# Patient Record
Sex: Male | Born: 1950 | Race: Black or African American | Hispanic: No | Marital: Single
Health system: Southern US, Community
[De-identification: ages and names within clinical notes are randomized; demographics above are authoritative.]

## PROBLEM LIST (undated history)

## (undated) DIAGNOSIS — E785 Hyperlipidemia, unspecified: Secondary | ICD-10-CM

## (undated) DIAGNOSIS — M549 Dorsalgia, unspecified: Secondary | ICD-10-CM

## (undated) DIAGNOSIS — H409 Unspecified glaucoma: Secondary | ICD-10-CM

## (undated) DIAGNOSIS — G8929 Other chronic pain: Secondary | ICD-10-CM

## (undated) DIAGNOSIS — B20 Human immunodeficiency virus [HIV] disease: Secondary | ICD-10-CM

## (undated) DIAGNOSIS — R9431 Abnormal electrocardiogram [ECG] [EKG]: Secondary | ICD-10-CM

## (undated) DIAGNOSIS — I639 Cerebral infarction, unspecified: Secondary | ICD-10-CM

## (undated) HISTORY — PX: PROSTATE SURGERY: SHX751

## (undated) HISTORY — PX: BACK SURGERY: SHX140

## (undated) HISTORY — PX: TONSILLECTOMY: SUR1361

## (undated) HISTORY — PX: WISDOM TOOTH EXTRACTION: SHX21

## (undated) HISTORY — PX: HERNIA REPAIR: SHX51

---

## 2017-10-09 ENCOUNTER — Observation Stay (HOSPITAL_COMMUNITY)
Admission: EM | Admit: 2017-10-09 | Discharge: 2017-10-10 | Disposition: A | Discharge status: Home / Self Care | Payer: PRIVATE HEALTH INSURANCE | Attending: Internal Medicine | Admitting: Internal Medicine

## 2017-10-09 ENCOUNTER — Emergency Department (HOSPITAL_COMMUNITY): Payer: PRIVATE HEALTH INSURANCE

## 2017-10-09 ENCOUNTER — Encounter (HOSPITAL_COMMUNITY): Payer: Self-pay

## 2017-10-09 ENCOUNTER — Other Ambulatory Visit: Payer: Self-pay

## 2017-10-09 ENCOUNTER — Inpatient Hospital Stay (HOSPITAL_COMMUNITY): Payer: PRIVATE HEALTH INSURANCE

## 2017-10-09 DIAGNOSIS — Z79899 Other long term (current) drug therapy: Secondary | ICD-10-CM | POA: Insufficient documentation

## 2017-10-09 DIAGNOSIS — H409 Unspecified glaucoma: Secondary | ICD-10-CM

## 2017-10-09 DIAGNOSIS — E785 Hyperlipidemia, unspecified: Secondary | ICD-10-CM | POA: Diagnosis not present

## 2017-10-09 DIAGNOSIS — I4510 Unspecified right bundle-branch block: Secondary | ICD-10-CM | POA: Insufficient documentation

## 2017-10-09 DIAGNOSIS — R55 Syncope and collapse: Secondary | ICD-10-CM | POA: Insufficient documentation

## 2017-10-09 DIAGNOSIS — G4733 Obstructive sleep apnea (adult) (pediatric): Secondary | ICD-10-CM | POA: Diagnosis not present

## 2017-10-09 DIAGNOSIS — G8929 Other chronic pain: Secondary | ICD-10-CM | POA: Insufficient documentation

## 2017-10-09 DIAGNOSIS — R4781 Slurred speech: Secondary | ICD-10-CM | POA: Diagnosis not present

## 2017-10-09 DIAGNOSIS — R200 Anesthesia of skin: Secondary | ICD-10-CM | POA: Insufficient documentation

## 2017-10-09 DIAGNOSIS — R079 Chest pain, unspecified: Secondary | ICD-10-CM | POA: Insufficient documentation

## 2017-10-09 DIAGNOSIS — I272 Pulmonary hypertension, unspecified: Secondary | ICD-10-CM | POA: Diagnosis not present

## 2017-10-09 DIAGNOSIS — R61 Generalized hyperhidrosis: Secondary | ICD-10-CM | POA: Insufficient documentation

## 2017-10-09 DIAGNOSIS — F410 Panic disorder [episodic paroxysmal anxiety] without agoraphobia: Secondary | ICD-10-CM | POA: Insufficient documentation

## 2017-10-09 DIAGNOSIS — Z21 Asymptomatic human immunodeficiency virus [HIV] infection status: Secondary | ICD-10-CM | POA: Diagnosis not present

## 2017-10-09 DIAGNOSIS — M1990 Unspecified osteoarthritis, unspecified site: Secondary | ICD-10-CM | POA: Insufficient documentation

## 2017-10-09 DIAGNOSIS — I452 Bifascicular block: Secondary | ICD-10-CM | POA: Insufficient documentation

## 2017-10-09 DIAGNOSIS — B20 Human immunodeficiency virus [HIV] disease: Secondary | ICD-10-CM

## 2017-10-09 DIAGNOSIS — Z981 Arthrodesis status: Secondary | ICD-10-CM | POA: Insufficient documentation

## 2017-10-09 DIAGNOSIS — R110 Nausea: Secondary | ICD-10-CM | POA: Diagnosis not present

## 2017-10-09 DIAGNOSIS — Z91012 Allergy to eggs: Secondary | ICD-10-CM | POA: Insufficient documentation

## 2017-10-09 DIAGNOSIS — R4701 Aphasia: Secondary | ICD-10-CM | POA: Insufficient documentation

## 2017-10-09 DIAGNOSIS — I083 Combined rheumatic disorders of mitral, aortic and tricuspid valves: Secondary | ICD-10-CM | POA: Insufficient documentation

## 2017-10-09 DIAGNOSIS — I6782 Cerebral ischemia: Secondary | ICD-10-CM | POA: Diagnosis not present

## 2017-10-09 DIAGNOSIS — I639 Cerebral infarction, unspecified: Secondary | ICD-10-CM | POA: Diagnosis present

## 2017-10-09 DIAGNOSIS — Z7982 Long term (current) use of aspirin: Secondary | ICD-10-CM | POA: Insufficient documentation

## 2017-10-09 DIAGNOSIS — R42 Dizziness and giddiness: Principal | ICD-10-CM

## 2017-10-09 DIAGNOSIS — R531 Weakness: Secondary | ICD-10-CM | POA: Diagnosis present

## 2017-10-09 DIAGNOSIS — R29810 Facial weakness: Secondary | ICD-10-CM | POA: Diagnosis not present

## 2017-10-09 DIAGNOSIS — R0789 Other chest pain: Secondary | ICD-10-CM

## 2017-10-09 DIAGNOSIS — Z8673 Personal history of transient ischemic attack (TIA), and cerebral infarction without residual deficits: Secondary | ICD-10-CM | POA: Insufficient documentation

## 2017-10-09 DIAGNOSIS — M549 Dorsalgia, unspecified: Secondary | ICD-10-CM | POA: Insufficient documentation

## 2017-10-09 HISTORY — DX: Abnormal electrocardiogram (ECG) (EKG): R94.31

## 2017-10-09 HISTORY — DX: Dorsalgia, unspecified: M54.9

## 2017-10-09 HISTORY — DX: Human immunodeficiency virus (HIV) disease: B20

## 2017-10-09 HISTORY — DX: Cerebral infarction, unspecified: I63.9

## 2017-10-09 HISTORY — DX: Unspecified glaucoma: H40.9

## 2017-10-09 HISTORY — DX: Other chronic pain: G89.29

## 2017-10-09 HISTORY — DX: Hyperlipidemia, unspecified: E78.5

## 2017-10-09 LAB — URINALYSIS, ROUTINE W REFLEX MICROSCOPIC
BACTERIA UA: NONE SEEN
BILIRUBIN URINE: NEGATIVE
GLUCOSE, UA: NEGATIVE mg/dL
KETONES UR: NEGATIVE mg/dL
LEUKOCYTES UA: NEGATIVE
NITRITE: NEGATIVE
PH: 6 (ref 5.0–8.0)
Protein, ur: NEGATIVE mg/dL
Specific Gravity, Urine: 1.012 (ref 1.005–1.030)
Squamous Epithelial / LPF: NONE SEEN

## 2017-10-09 LAB — BASIC METABOLIC PANEL
ANION GAP: 6 (ref 5–15)
BUN: 15 mg/dL (ref 6–20)
CO2: 25 mmol/L (ref 22–32)
Calcium: 9.1 mg/dL (ref 8.9–10.3)
Chloride: 108 mmol/L (ref 101–111)
Creatinine, Ser: 0.95 mg/dL (ref 0.61–1.24)
GFR calc Af Amer: >60 mL/min (ref >60)
GLUCOSE: 100 mg/dL — AB (ref 65–99)
POTASSIUM: 3.6 mmol/L (ref 3.5–5.1)
Sodium: 139 mmol/L (ref 135–145)

## 2017-10-09 LAB — CBC
HEMATOCRIT: 37.1 % — AB (ref 39.0–52.0)
HEMOGLOBIN: 12.5 g/dL — AB (ref 13.0–17.0)
MCH: 35.4 pg — ABNORMAL HIGH (ref 26.0–34.0)
MCHC: 33.7 g/dL (ref 30.0–36.0)
MCV: 105.1 fL — ABNORMAL HIGH (ref 78.0–100.0)
Platelets: 195 10*3/uL (ref 150–400)
RBC: 3.53 MIL/uL — ABNORMAL LOW (ref 4.22–5.81)
RDW: 13.3 % (ref 11.5–15.5)
WBC: 3.7 10*3/uL — ABNORMAL LOW (ref 4.0–10.5)

## 2017-10-09 LAB — TROPONIN I: Troponin I: <0.03 ng/mL (ref <0.03)

## 2017-10-09 LAB — CBG MONITORING, ED: Glucose-Capillary: 88 mg/dL (ref 65–99)

## 2017-10-09 LAB — I-STAT TROPONIN, ED: Troponin i, poc: 0 ng/mL (ref 0.00–0.08)

## 2017-10-09 MED ORDER — LATANOPROST 0.005 % OP SOLN
1.0000 [drp] | Freq: Every day | OPHTHALMIC | Status: DC
  Filled 2017-10-09: qty 2.5

## 2017-10-09 MED ORDER — TIMOLOL MALEATE 0.5 % OP SOLN
1.0000 [drp] | Freq: Every day | OPHTHALMIC | Status: DC
  Filled 2017-10-09: qty 5

## 2017-10-09 MED ORDER — ATORVASTATIN CALCIUM 10 MG PO TABS
20.0000 mg | ORAL_TABLET | Freq: Every day | ORAL | Status: DC
  Administered 2017-10-10: 20 mg via ORAL
  Filled 2017-10-09: qty 2

## 2017-10-09 MED ORDER — ACETAMINOPHEN 325 MG PO TABS: 650.0000 mg | ORAL_TABLET | ORAL | Status: DC | PRN

## 2017-10-09 MED ORDER — ZOLPIDEM TARTRATE 5 MG PO TABS
5.0000 mg | ORAL_TABLET | Freq: Once | ORAL | Status: AC
  Administered 2017-10-09: 5 mg via ORAL
  Filled 2017-10-09: qty 1

## 2017-10-09 MED ORDER — BRIMONIDINE TARTRATE 0.2 % OP SOLN
1.0000 [drp] | Freq: Every day | OPHTHALMIC | Status: DC
  Filled 2017-10-09: qty 5

## 2017-10-09 MED ORDER — OXYCODONE-ACETAMINOPHEN 5-325 MG PO TABS
1.0000 | ORAL_TABLET | Freq: Once | ORAL | Status: AC
  Administered 2017-10-09: 1 via ORAL
  Filled 2017-10-09: qty 1

## 2017-10-09 MED ORDER — ACETAMINOPHEN 650 MG RE SUPP: 650.0000 mg | RECTAL | Status: DC | PRN

## 2017-10-09 MED ORDER — METHOCARBAMOL 750 MG PO TABS
750.0000 mg | ORAL_TABLET | Freq: Three times a day (TID) | ORAL | Status: DC | PRN
  Administered 2017-10-09 – 2017-10-10 (×2): 750 mg via ORAL
  Filled 2017-10-09 (×2): qty 1

## 2017-10-09 MED ORDER — ACYCLOVIR 400 MG PO TABS
400.0000 mg | ORAL_TABLET | Freq: Two times a day (BID) | ORAL | Status: DC
  Administered 2017-10-09 – 2017-10-10 (×2): 400 mg via ORAL
  Filled 2017-10-09 (×2): qty 1

## 2017-10-09 MED ORDER — ACETAMINOPHEN 160 MG/5ML PO SOLN: 650.0000 mg | ORAL | Status: DC | PRN

## 2017-10-09 MED ORDER — BRIMONIDINE TARTRATE-TIMOLOL 0.2-0.5 % OP SOLN
1.0000 [drp] | Freq: Every day | OPHTHALMIC | Status: DC
  Filled 2017-10-09: qty 5

## 2017-10-09 MED ORDER — LAMIVUDINE-ZIDOVUDINE 150-300 MG PO TABS
1.0000 | ORAL_TABLET | Freq: Two times a day (BID) | ORAL | Status: DC
  Administered 2017-10-09 – 2017-10-10 (×2): 1 via ORAL
  Filled 2017-10-09 (×2): qty 1

## 2017-10-09 MED ORDER — HYDROCODONE-ACETAMINOPHEN 10-325 MG PO TABS
1.0000 | ORAL_TABLET | Freq: Four times a day (QID) | ORAL | Status: DC | PRN
  Administered 2017-10-09 – 2017-10-10 (×2): 1 via ORAL
  Filled 2017-10-09 (×2): qty 1

## 2017-10-09 MED ORDER — STROKE: EARLY STAGES OF RECOVERY BOOK
Freq: Once | Status: DC
  Filled 2017-10-09: qty 1

## 2017-10-09 MED ORDER — MORPHINE SULFATE (PF) 4 MG/ML IV SOLN
2.0000 mg | INTRAVENOUS | Status: DC | PRN
  Administered 2017-10-09 – 2017-10-10 (×2): 2 mg via INTRAVENOUS
  Filled 2017-10-09 (×2): qty 1

## 2017-10-09 MED ORDER — SODIUM CHLORIDE 0.9 % IV SOLN
INTRAVENOUS | Status: DC
  Administered 2017-10-09: 17:00:00 via INTRAVENOUS

## 2017-10-09 MED ORDER — FLUTICASONE PROPIONATE 50 MCG/ACT NA SUSP
2.0000 | Freq: Every day | NASAL | Status: DC
  Administered 2017-10-09 – 2017-10-10 (×2): 2 via NASAL
  Filled 2017-10-09: qty 16

## 2017-10-09 MED ORDER — NEVIRAPINE 200 MG PO TABS: 200.0000 mg | ORAL_TABLET | Freq: Two times a day (BID) | ORAL | Status: DC

## 2017-10-09 MED ORDER — BRINZOLAMIDE 1 % OP SUSP
1.0000 [drp] | Freq: Three times a day (TID) | OPHTHALMIC | Status: DC
  Administered 2017-10-09 – 2017-10-10 (×3): 1 [drp] via OPHTHALMIC
  Filled 2017-10-09: qty 10

## 2017-10-09 MED ORDER — SENNOSIDES-DOCUSATE SODIUM 8.6-50 MG PO TABS: 1.0000 | ORAL_TABLET | Freq: Every evening | ORAL | Status: DC | PRN

## 2017-10-09 NOTE — ED Triage Notes (Signed)
Pt brought in by [facility] from church for a near syncopal episode. Pt states he was sitting inside when he got hot and felt as though he was going to pass out. Pt c/o left sided numbness that has been off and on for a few months. Per EMS stroke screen was negative. Pt also c/o chronic back pain. Pt states he has a hx of an abnormal EKG, but is not sure what is abnormal about it. Per EMS a Left BBB is noted.

## 2017-10-09 NOTE — ED Notes (Signed)
Pt asking for pain medicine for headache and backache. Informed Dr. Rubin PayorPickering.

## 2017-10-09 NOTE — Progress Notes (Signed)
Patient arrived to unit.  Alert, verbal with no complaints of pain or discomfort.  Family at bedside.

## 2017-10-09 NOTE — ED Provider Notes (Signed)
[facility] EMERGENCY [facility] Provider Note   CSN: [id number] Arrival date & time: 10/09/17  1219     History   Chief Complaint Chief Complaint  Patient presents with  . Near Syncope    HPI Ernest Wallace is a 66 y.o. male.  HPI Patient presents with episode of sweatiness lightheadedness difficulty speaking and chest pain.  Was at church when it has happened.  Reportedly was having appropriate answers but slowed.  Was sweaty.  He states he felt bad with it.  Has had a couple episodes of these in the past.  Also has episodes when he wakes up where his left side has been week or number.  States it will occasionally happen on the right side 2.  States he has been seen by his primary care doctor and told that he had a stroke.  However he has not had a CT scan or more workup.  States is been scheduled to have more of an outpatient workup for this.  States he has not had problems with exertion.  States he has slight chest tightness with the episodes today.  States he has had episodes where he had difficulty moving his left hand but that was not associated with the episode today. Past Medical History:  Diagnosis Date  . Abnormal EKG   . Chronic back pain   . Glaucoma   . HIV (human immunodeficiency virus infection) (HCC)   . Hyperlipidemia   . Stroke [facility](HCC)     Patient Active Problem List   Diagnosis Date Noted  . CVA (cerebral vascular accident) (HCC) 10/09/2017    Past Surgical History:  Procedure Laterality Date  . BACK SURGERY    . HERNIA REPAIR    . PROSTATE SURGERY    . TONSILLECTOMY    . WISDOM TOOTH EXTRACTION         Home Medications    Prior to Admission medications   Medication Sig Start Date End Date Taking? Authorizing Provider  acyclovir (ZOVIRAX) 400 MG tablet Take 400 mg by mouth 2 (two) times daily. 09/13/17  Yes [provider]  aspirin 325 MG tablet Take 325 mg by mouth daily.   Yes [provider]  atorvastatin  (LIPITOR) 20 MG tablet Take 20 mg by mouth daily.   Yes [provider]  bimatoprost (LUMIGAN) 0.01 % SOLN Place 1 drop into both eyes at bedtime.   Yes [provider]  Brimonidine Tartrate-Timolol (COMBIGAN OP) Apply 1 drop to eye daily.   Yes [provider]  brinzolamide (AZOPT) 1 % ophthalmic suspension Place 1 drop into both eyes 3 (three) times daily.   Yes [provider]  fluticasone (FLONASE) 50 MCG/ACT nasal spray Place 2 sprays into both nostrils daily.   Yes [provider]  HYDROcodone-acetaminophen (NORCO) 7.5-325 MG tablet Take 1 tablet by mouth 4 (four) times daily. 09/06/17  Yes [provider]  Lamivudine-Zidovudine (COMBIVIR PO) Take 1 tablet by mouth 2 (two) times daily.   Yes [provider]  methocarbamol (ROBAXIN) 500 MG tablet Take 500 mg by mouth 4 (four) times daily. Muscle spasms   Yes [provider]    Family History No family history on file.  Social History Social History   Tobacco Use  . Smoking status: Not on file  Substance Use Topics  . Alcohol use: Not on file  . Drug use: Not on file     Allergies   Eggs or egg-derived products   Review of  Systems Review of Systems  Constitutional: Positive for diaphoresis.  HENT: Negative for congestion.   Eyes: Negative for photophobia.  Respiratory: Negative for shortness of breath.   Cardiovascular: Positive for chest pain.  Gastrointestinal: Negative for abdominal pain.  Genitourinary: Negative for flank pain.  Musculoskeletal: Negative for back pain.  Skin: Negative for rash.  Neurological: Positive for speech difficulty and weakness.  Hematological: Negative for adenopathy.  Psychiatric/Behavioral: Positive for confusion.     Physical Exam Updated Vital Signs BP (!) 149/79   Pulse (!) 104   Temp 98.4 F (36.9 C) (Oral)   Resp (!) 22   Ht 6' (1.829 m)   Wt 95.3 kg (210 lb)   SpO2 100%   BMI 28.48 kg/m   Physical  Exam  Constitutional: He is oriented to person, place, and time. He appears well-developed and well-nourished.  HENT:  Head: Atraumatic.  Eyes: EOM are normal. Pupils are equal, round, and reactive to light.  Neck: Neck supple.  Cardiovascular: Normal rate.  Pulmonary/Chest: Effort normal.  Abdominal: Soft. There is no tenderness.  Musculoskeletal:  No edema.  No tenderness over neck or extremities.  Neurological: He is alert and oriented to person, place, and time.  Face symmetric.  Eye movements intact.  Finger-nose intact bilaterally.  Good grip strength bilaterally.  Good straight leg raise bilaterally.  Good flexion and extension at the ankle bilaterally.  Skin: Skin is warm. Capillary refill takes less than 2 seconds.  Psychiatric: He has a normal mood and affect.     ED Treatments / Results  Labs (all labs ordered are listed, but only abnormal results are displayed) Labs Reviewed  BASIC METABOLIC PANEL - Abnormal; Notable for the following components:      Result Value   Glucose, Bld 100 (*)    All other components within normal limits  CBC - Abnormal; Notable for the following components:   WBC 3.7 (*)    RBC 3.53 (*)    Hemoglobin 12.5 (*)    HCT 37.1 (*)    MCV 105.1 (*)    MCH 35.4 (*)    All other components within normal limits  URINALYSIS, ROUTINE W REFLEX MICROSCOPIC - Abnormal; Notable for the following components:   Hgb urine dipstick SMALL (*)    All other components within normal limits  TROPONIN I  TROPONIN I  TROPONIN I  CBG MONITORING, ED  I-STAT TROPONIN, ED    EKG  EKG Interpretation  Date/Time:  Sunday October 09 2017 12:25:18 EST Ventricular Rate:  62 PR Interval:    QRS Duration: 151 QT Interval:  457 QTC Calculation: 465 R Axis:   -53 Text Interpretation:  Sinus rhythm Left atrial enlargement RBBB and LAFB Confirmed by Benjiman Core [phone]) on 10/09/2017 12:38:22 PM       Radiology Dg Chest 2 View  Result Date:  10/09/2017 CLINICAL DATA:  Pt c/o nausea, chest pain, sweating, left sided face drooping, lip and arm numbness, weakness, dizziness and irregular BP x a few days. EXAM: CHEST  2 VIEW COMPARISON:  None. FINDINGS: Cardiac silhouette is normal in size and configuration. Normal mediastinal and hilar contours. Lungs are clear.  No pleural effusion or pneumothorax. There has been a previous posterior fusion at the thoracolumbar junction. Skeletal structures are intact. IMPRESSION: No active cardiopulmonary disease. Electronically Signed   By: Amie Portland M.D.   On: 10/09/2017 14:21   Ct Head Wo Contrast  Result Date: 10/09/2017 CLINICAL DATA:  Lt side facial numbness slurred  speech aphasia this morning seems to be ok now EXAM: CT HEAD WITHOUT CONTRAST TECHNIQUE: Contiguous axial images were obtained from the base of the skull through the vertex without intravenous contrast. COMPARISON:  None. FINDINGS: Brain: No evidence of acute infarction, hemorrhage, hydrocephalus, extra-axial collection or mass lesion/mass effect. Vascular: No hyperdense vessel or unexpected calcification. Skull: Normal. Negative for fracture or focal lesion. Sinuses/Orbits: Globes and orbits are unremarkable. Visualized sinuses and mastoid air cells are clear. Other: None. IMPRESSION: Normal unenhanced CT scan of the brain. Electronically Signed   By: Amie Portlandavid  Ormond M.D.   On: 10/09/2017 14:09    Procedures Procedures (including critical care time)  Medications Ordered in ED Medications   stroke: mapping our early stages of recovery book (not administered)  0.9 %  sodium chloride infusion (not administered)  acetaminophen (TYLENOL) tablet 650 mg (not administered)    Or  acetaminophen (TYLENOL) solution 650 mg (not administered)    Or  acetaminophen (TYLENOL) suppository 650 mg (not administered)  senna-docusate (Senokot-S) tablet 1 tablet (not administered)  atorvastatin (LIPITOR) tablet 20 mg (not administered)  latanoprost  (XALATAN) 0.005 % ophthalmic solution 1 drop (not administered)  fluticasone (FLONASE) 50 MCG/ACT nasal spray 2 spray (not administered)  brinzolamide (AZOPT) 1 % ophthalmic suspension 1 drop (not administered)  brimonidine-timolol (COMBIGAN) 0.2-0.5 % ophthalmic solution 1 drop (not administered)  morphine 4 MG/ML injection 2 mg (not administered)  zolpidem (AMBIEN) tablet 5 mg (not administered)  oxyCODONE-acetaminophen (PERCOCET/ROXICET) 5-325 MG per tablet 1 tablet (1 tablet Oral Given 10/09/17 1458)     Initial Impression / Assessment and Plan / ED Course  I have reviewed the triage vital signs and the nursing notes.  Pertinent labs & imaging results that were available during my care of the patient were reviewed by me and considered in my medical decision making (see chart for details).     Patient presents with episode of diaphoresis near syncope and difficulty speaking.  Reportedly blood pressure was mildly elevated at the time.  However over the last couple months has had episodes where he has difficulty moving his left side.  EKG shows right bundle branch block there is likely old although I do not have an old to compare it to.  Rather nonfocal neuro exam.  However with the near syncope diaphoresis and chest pain along with the neuro deficits that time I think patient would benefit from an admission for further evaluation treatment.  Final Clinical Impressions(s) / ED Diagnoses   Final diagnoses:  Near syncope  Weakness    ED Discharge Orders    None       Benjiman CorePickering, Tamira Ryland, MD 10/09/17 1626

## 2017-10-09 NOTE — H&P (Signed)
History and Physical    Ernest Wallace AVW:[id number] DOB: 12-11-1950 DOA: 10/09/2017  PCP: No primary care provider on file. Patient coming from: Home.  [place]  Chief Complaint: Chest pain and bilat arm weakness  HPI: Ernest Wallace is a 66 y.o. male with medical history significant of CVA,, Hyperlipemia and chronic back pain.  Pt reports today while he was in church he had an episode of chest pain.  Pt reports he had numbness in both arms.  Pt reports he felt light headed and had some difficulty speaking. Pt reports he had a similar episode previously. Pt was seen by his Primary MD, a neurologist and his ophthalmologist  who felt like he had a stroke.  Pt was advised by his MD that he needs to have carotid studies and further stroke evaluation.  Pt reports he has had several recent episodes of chest pain.  Pt reports it normally does not bother him but today he felt bad.  Pt reports he felt sweaty and episode concerned him.     ED Course: Pt was seen in the Emergency department and had a ct scan that was normal.  Troponin was negative.    Review of Systems: As per HPI otherwise all other systems reviewed and are negative  Review of Systems  Constitutional: Negative for chills and fever.  HENT: Negative for sore throat.   Respiratory: Negative for cough.   Cardiovascular: Positive for chest pain.  Gastrointestinal: Positive for nausea. Negative for vomiting.  Musculoskeletal: Positive for back pain. Negative for neck pain.  Skin: Negative for itching.  Neurological: Negative for focal weakness.  Psychiatric/Behavioral: The patient is not nervous/anxious.   All other systems reviewed and are negative.  Ambulatory Status Ambulates with some difficulty due to back problems.  Past Medical History:  Diagnosis Date  . Abnormal EKG   . Chronic back pain   . Glaucoma   . HIV (human immunodeficiency virus infection) (HCC)   . Hyperlipidemia   . Stroke  [facility](HCC)     Past Surgical History:  Procedure Laterality Date  . BACK SURGERY    . HERNIA REPAIR    . PROSTATE SURGERY    . TONSILLECTOMY    . WISDOM TOOTH EXTRACTION      Social History   Socioeconomic History  . Marital status: Single    Spouse name: Not on file  . Number of children: Not on file  . Years of education: Not on file  . Highest education level: Not on file  Social Needs  . Financial resource strain: Not on file  . Food insecurity - worry: Not on file  . Food insecurity - inability: Not on file  . Transportation needs - medical: Not on file  . Transportation needs - non-medical: Not on file  Occupational History  . Not on file  Tobacco Use  . Smoking status: Not on file  Substance and Sexual Activity  . Alcohol use: Not on file  . Drug use: Not on file  . Sexual activity: Not on file  Other Topics Concern  . Not on file  Social History Narrative  . Not on file    Allergies  Allergen Reactions  . Eggs Or Egg-Derived Products Anaphylaxis    No family history on file.  Prior to Admission medications   Medication Sig Start Date End Date Taking? Authorizing Provider  acyclovir (ZOVIRAX) 400 MG tablet Take 400 mg by mouth 2 (two) times daily. 09/13/17  Yes [provider]  aspirin 325 MG tablet Take 325 mg by mouth daily.   Yes [provider]  atorvastatin (LIPITOR) 20 MG tablet Take 20 mg by mouth daily.   Yes [provider]  bimatoprost (LUMIGAN) 0.01 % SOLN Place 1 drop into both eyes at bedtime.   Yes [provider]  Brimonidine Tartrate-Timolol (COMBIGAN OP) Apply 1 drop to eye daily.   Yes [provider]  brinzolamide (AZOPT) 1 % ophthalmic suspension Place 1 drop into both eyes 3 (three) times daily.   Yes [provider]  fluticasone (FLONASE) 50 MCG/ACT nasal spray Place 2 sprays into both nostrils daily.   Yes [provider]  HYDROcodone-acetaminophen (NORCO) 7.5-325 MG tablet  Take 1 tablet by mouth 4 (four) times daily. 09/06/17  Yes [provider]  Lamivudine-Zidovudine (COMBIVIR PO) Take 1 tablet by mouth 2 (two) times daily.   Yes [provider]  methocarbamol (ROBAXIN) 500 MG tablet Take 500 mg by mouth 4 (four) times daily. Muscle spasms   Yes [provider]    Physical Exam: Vitals:   10/09/17 1251 10/09/17 1345 10/09/17 1400 10/09/17 1545  BP:  137/84 (!) 121/55 (!) 149/79  Pulse:  60 (!) 58 (!) 104  Resp:  (!) 21 17 (!) 22  Temp:      TempSrc:      SpO2:  100% 96% 100%  Weight: 95.3 kg (210 lb)     Height: 6' (1.829 m)        General:  Appears calm and comfortable Eyes:  PERRL, EOMI, normal lids, iris ENT:  grossly normal hearing, lips & tongue, mmm Neck:  no LAD, masses or thyromegaly Cardiovascular:  RRR, no m/r/g. No LE edema.  Respiratory:  CTA bilaterally, no w/r/r. Normal respiratory effort. Abdomen:  soft, ntnd, NABS Skin:  no rash or induration seen on limited exam Musculoskeletal:  grossly normal tone BUE/BLE, good ROM, no bony abnormality Psychiatric:  grossly normal mood and affect, speech fluent and appropriate, AOx3 Neurologic:  CN 2-12 grossly intact, moves all extremities in coordinated fashion, sensation intact  Labs on Admission: I have personally reviewed following labs and imaging studies  CBC: Recent Labs  Lab 10/09/17 1243  WBC 3.7*  HGB 12.5*  HCT 37.1*  MCV 105.1*  PLT 195   Basic Metabolic Panel: Recent Labs  Lab 10/09/17 1243  NA 139  K 3.6  CL 108  CO2 25  GLUCOSE 100*  BUN 15  CREATININE 0.95  CALCIUM 9.1   GFR: Estimated Creatinine Clearance: 91.6 mL/min (by C-G formula based on SCr of 0.95 mg/dL). Liver Function Tests: No results for input(s): AST, ALT, ALKPHOS, BILITOT, PROT, ALBUMIN in the last 168 hours. No results for input(s): LIPASE, AMYLASE in the last 168 hours. No results for input(s): AMMONIA in the last 168 hours. Coagulation Profile: No results for  input(s): INR, PROTIME in the last 168 hours. Cardiac Enzymes: No results for input(s): CKTOTAL, CKMB, CKMBINDEX, TROPONINI in the last 168 hours. BNP (last 3 results) No results for input(s): PROBNP in the last 8760 hours. HbA1C: No results for input(s): HGBA1C in the last 72 hours. CBG: Recent Labs  Lab 10/09/17 1250  GLUCAP 88   Lipid Profile: No results for input(s): CHOL, HDL, LDLCALC, TRIG, CHOLHDL, LDLDIRECT in the last 72 hours. Thyroid Function Tests: No results for input(s): TSH, T4TOTAL, FREET4, T3FREE, THYROIDAB in the last 72 hours. Anemia Panel: No results for input(s): VITAMINB12, FOLATE, FERRITIN, TIBC, IRON, RETICCTPCT  in the last 72 hours. Urine analysis:    Component Value Date/Time   COLORURINE YELLOW 10/09/2017 1243   APPEARANCEUR CLEAR 10/09/2017 1243   LABSPEC 1.012 10/09/2017 1243   PHURINE 6.0 10/09/2017 1243   GLUCOSEU NEGATIVE 10/09/2017 1243   HGBUR SMALL (A) 10/09/2017 1243   BILIRUBINUR NEGATIVE 10/09/2017 1243   KETONESUR NEGATIVE 10/09/2017 1243   PROTEINUR NEGATIVE 10/09/2017 1243   NITRITE NEGATIVE 10/09/2017 1243   LEUKOCYTESUR NEGATIVE 10/09/2017 1243    Creatinine Clearance: Estimated Creatinine Clearance: 91.6 mL/min (by C-G formula based on SCr of 0.95 mg/dL).  Sepsis Labs: @LABRCNTIP (procalcitonin:4,lacticidven:4) )No results found for this or any previous visit (from the past 240 hour(s)).   Radiological Exams on Admission: Dg Chest 2 View  Result Date: 10/09/2017 CLINICAL DATA:  Pt c/o nausea, chest pain, sweating, left sided face drooping, lip and arm numbness, weakness, dizziness and irregular BP x a few days. EXAM: CHEST  2 VIEW COMPARISON:  None. FINDINGS: Cardiac silhouette is normal in size and configuration. Normal mediastinal and hilar contours. Lungs are clear.  No pleural effusion or pneumothorax. There has been a previous posterior fusion at the thoracolumbar junction. Skeletal structures are intact. IMPRESSION: No  active cardiopulmonary disease. Electronically Signed   By: Amie Portland M.D.   On: 10/09/2017 14:21   Ct Head Wo Contrast  Result Date: 10/09/2017 CLINICAL DATA:  Lt side facial numbness slurred speech aphasia this morning seems to be ok now EXAM: CT HEAD WITHOUT CONTRAST TECHNIQUE: Contiguous axial images were obtained from the base of the skull through the vertex without intravenous contrast. COMPARISON:  None. FINDINGS: Brain: No evidence of acute infarction, hemorrhage, hydrocephalus, extra-axial collection or mass lesion/mass effect. Vascular: No hyperdense vessel or unexpected calcification. Skull: Normal. Negative for fracture or focal lesion. Sinuses/Orbits: Globes and orbits are unremarkable. Visualized sinuses and mastoid air cells are clear. Other: None. IMPRESSION: Normal unenhanced CT scan of the brain. Electronically Signed   By: Amie Portland M.D.   On: 10/09/2017 14:09    EKG: Independently reviewed.   Assessment/Plan   1) TIA/Ruleout CVA Patient had strokelike symptoms today that are resolving.  Patient had a recent stroke which was evaluated by his ophthalmologist neurologist and primary care physician who felt patient needed imaging echocardiogram and carotid Dopplers.  This evaluation is currently pending in [place].  It is concerning that symptoms are indicative of a impending large stroke.  2) Chest Pain  Patient to be monitored and have serial troponins   3)Hyperlipidemia Continue Lipitor  4) Glaucoma Continue current eyedrops  DVT prophylaxis: Lovenox Code Status: Full Family Communication: Family at bedside  Disposition Plan: Admit for evaluation  Consults called:  Admission status: Inpatient    Langston Masker PA-C [facility]  If 7PM-7AM, please contact night-coverage [email or web address] Password [email or web address]  10/09/2017, 4:12 PM

## 2017-10-10 ENCOUNTER — Inpatient Hospital Stay (HOSPITAL_COMMUNITY): Payer: PRIVATE HEALTH INSURANCE

## 2017-10-10 ENCOUNTER — Encounter (HOSPITAL_COMMUNITY): Payer: Self-pay | Admitting: *Deleted

## 2017-10-10 ENCOUNTER — Inpatient Hospital Stay (HOSPITAL_COMMUNITY): Payer: Medicare Other

## 2017-10-10 DIAGNOSIS — B20 Human immunodeficiency virus [HIV] disease: Secondary | ICD-10-CM

## 2017-10-10 DIAGNOSIS — I340 Nonrheumatic mitral (valve) insufficiency: Secondary | ICD-10-CM

## 2017-10-10 DIAGNOSIS — H409 Unspecified glaucoma: Secondary | ICD-10-CM

## 2017-10-10 DIAGNOSIS — R55 Syncope and collapse: Secondary | ICD-10-CM

## 2017-10-10 DIAGNOSIS — R0789 Other chest pain: Secondary | ICD-10-CM

## 2017-10-10 DIAGNOSIS — R42 Dizziness and giddiness: Secondary | ICD-10-CM

## 2017-10-10 DIAGNOSIS — G4733 Obstructive sleep apnea (adult) (pediatric): Secondary | ICD-10-CM

## 2017-10-10 LAB — TROPONIN I
Troponin I: <0.03 ng/mL (ref <0.03)
Troponin I: <0.03 ng/mL (ref <0.03)

## 2017-10-10 LAB — ECHOCARDIOGRAM COMPLETE
FS: 47 % — AB (ref 28–44)
Height: 72 in
IVS/LV PW RATIO, ED: 1.26
LA ID, A-P, ES: 45 mm
LA diam end sys: 45 mm
LA diam index: 2.06 cm/m2
LA vol A4C: 65.1 mL
LA vol index: 34.8 mL/m2
LA vol: 75.8 mL
LV PW d: 12.5 mm — AB (ref 0.6–1.1)
LVOT SV: 59 mL
LVOT VTI: 23.2 cm
LVOT area: 2.54 cm2
LVOT diameter: 18 mm
LVOT peak grad rest: 7 mmHg
LVOT peak vel: 128 cm/s
Lateral S' vel: 9.45 cm/s
P 1/2 time: 514 ms
RV sys press: 61 mmHg
Reg peak vel: 381 cm/s
TAPSE: 22.1 mm
TDI e' medial: 5.72
TR max vel: 381 cm/s
Weight: 3360 [oz_av]

## 2017-10-10 LAB — LIPID PANEL
Cholesterol: 158 mg/dL (ref 0–200)
HDL: 50 mg/dL (ref >40)
LDL Cholesterol: 99 mg/dL (ref 0–99)
Total CHOL/HDL Ratio: 3.2 ratio
Triglycerides: 47 mg/dL (ref <150)
VLDL: 9 mg/dL (ref 0–40)

## 2017-10-10 LAB — HEMOGLOBIN A1C
Hgb A1c MFr Bld: 5.3 % (ref 4.8–5.6)
Mean Plasma Glucose: 105.41 mg/dL

## 2017-10-10 NOTE — Evaluation (Signed)
Physical Therapy Evaluation Patient Details Name: Ernest Wallace MRN: [id number] DOB: 29-Jul-1951 Today's Date: 10/10/2017   History of Present Illness  Ernest Wallace a 65 y.o.malewith medical history significant ofCVA,, Hyperlipemia and chronic back pain. Pt reports today while he was in church he had an episode of chest pain. Pt reports he had numbness in both arms. Pt reports he felt light headed and had some difficulty speaking. Pt with report of pressure on left side of chest. h/o of symptoms, on going work up in [place], [place] feels he has had a stroke.  Clinical Impression  Pt functioning near baseline. Pt reports feeling mildly fatigued compared to baseline but otherwise feels normal. Pt amb with cane at baseline and short community distances. Pt used RW today due to back pain and fatigue. Pt was able to ambulate without AD in room and holding onto counter/bed rails. Pt with chronic back pain limiting ambulation endurance at baseline. Pt visiting from [place] for the holiday and will be going home with daughter until Friday. Acute PT to con't to vollow.    Follow Up Recommendations No PT follow up;Supervision/Assistance - 24 hour    Equipment Recommendations  None recommended by PT(pt has DME, rec borrowing RW from church while down here)    Recommendations for Other Services       Precautions / Restrictions Precautions Precautions: None Restrictions Weight Bearing Restrictions: No      Mobility  Bed Mobility Overal bed mobility: Modified Independent             General bed mobility comments: HOB elevated, use of bed rail, increased time but no physical assist  Transfers Overall transfer level: Needs assistance Equipment used: None Transfers: Sit to/from Stand Sit to Stand: Min guard         General transfer comment: v/c's to push up from bed, increased time, min guard to steady pt during transition of hand from bed to RW  Ambulation/Gait Ambulation/Gait  assistance: Min guard Ambulation Distance (Feet): 100 Feet Assistive device: Rolling walker (2 wheeled) Gait Pattern/deviations: Decreased stride length;Step-through pattern Gait velocity: slow Gait velocity interpretation: Below normal speed for age/gender General Gait Details: decreased pace  Stairs            Wheelchair Mobility    Modified Rankin (Stroke Patients Only) Modified Rankin (Stroke Patients Only) Pre-Morbid Rankin Score: Slight disability Modified Rankin: Slight disability     Balance Overall balance assessment: Needs assistance     Sitting balance - Comments: pt able to sit at EOB and don socks without LOB       Standing balance comment: pt uses cane at baseline                             Pertinent Vitals/Pain Pain Assessment: 0-10 Pain Score: 5  Pain Location: chronic back pain, 1/10 L chest soreness Pain Intervention(s): Monitored during session    Home Living Family/patient expects to be discharged to:: Private residence Living Arrangements: (lives alone in [place] but visiting daughters now) Available Help at Discharge: Family;Available 24 hours/day(while in [place], lives alone ) Type of Home: House Home Access: Level entry     Home Layout: Two level Home Equipment: Walker - 2 wheels;Cane - single point Additional Comments: has a shower seat at home    Prior Function Level of Independence: Independent with assistive device(s)         Comments: uses cane primarily, does limited ambulation and  driving due to back pain     Hand Dominance   Dominant Hand: Right    Extremity/Trunk Assessment   Upper Extremity Assessment Upper Extremity Assessment: Generalized weakness    Lower Extremity Assessment Lower Extremity Assessment: LLE deficits/detail LLE Deficits / Details: grossly 4/5    Cervical / Trunk Assessment Cervical / Trunk Assessment: (chronic back pain)  Communication   Communication: No difficulties  Cognition  Arousal/Alertness: Awake/alert Behavior During Therapy: WFL for tasks assessed/performed Overall Cognitive Status: Within Functional Limits for tasks assessed                                        General Comments      Exercises     Assessment/Plan    PT Assessment Patient needs continued PT services  PT Problem List Decreased strength;Decreased activity tolerance;Decreased balance;Decreased mobility;Decreased knowledge of use of DME       PT Treatment Interventions DME instruction;Gait training;Stair training;Functional mobility training;Therapeutic activities;Therapeutic exercise;Balance training;Neuromuscular re-education    PT Goals (Current goals can be found in the Care Plan section)  Acute Rehab PT Goals Patient Stated Goal: home today PT Goal Formulation: With patient Time For Goal Achievement: 10/17/17 Potential to Achieve Goals: Good    Frequency Min 3X/week   Barriers to discharge ([place])      Co-evaluation               AM-PAC PT "6 Clicks" Daily Activity  Outcome Measure Difficulty turning over in bed (including adjusting bedclothes, sheets and blankets)?: Unable Difficulty moving from lying on back to sitting on the side of the bed? : Unable Difficulty sitting down on and standing up from a chair with arms (e.g., wheelchair, bedside commode, etc,.)?: Unable Help needed moving to and from a bed to chair (including a wheelchair)?: A Little Help needed walking in hospital room?: A Little Help needed climbing 3-5 steps with a railing? : A Lot 6 Click Score: 11    End of Session Equipment Utilized During Treatment: Gait belt Activity Tolerance: Patient tolerated treatment well Patient left: in chair;with call bell/phone within reach Nurse Communication: Mobility status PT Visit Diagnosis: Unsteadiness on feet (R26.81)    Time: [id number] PT Time Calculation (min) (ACUTE ONLY): 26  min   Charges:   PT Evaluation $PT Eval Moderate Complexity: 1 Mod PT Treatments $Gait Training: 8-22 mins   PT G Codes:        Lewis ShockAshly Elieser Tetrick, PT, DPT Pager #: [phone] Office #: [phone]   Bayleigh Loflin M Deandrae Wajda 10/10/2017, 7:58 AM

## 2017-10-10 NOTE — Progress Notes (Signed)
Pt discharged home with daughter. AVS given and reviewed with patient. Medical records release form filled out and faxed to medical records per patient request. All belongings sent with patient. VSS. BP (!) 149/83 (BP Location: Right Arm)   Pulse 63   Temp 97.9 F (36.6 C) (Oral)   Resp 19   Ht 6' (1.829 m)   Wt 95.3 kg (210 lb)   SpO2 100%   BMI 28.48 kg/m

## 2017-10-10 NOTE — Care Management Obs Status (Signed)
MEDICARE OBSERVATION STATUS NOTIFICATION   Patient Details  Name: Ernest Wallace MRN: [id number] Date of Birth: 10/04/1951   Medicare Observation Status Notification Given:  Yes    Kermit BaloKelli F Ritchie Klee, RN 10/10/2017, 1:19 PM

## 2017-10-10 NOTE — Progress Notes (Signed)
Patient called out during ECHO stating he was having left sided chest pain. Pt states it was more of a "pressure" than actual aching or sharp pain. He stated it fluctuated between a 2 and 7 on a scale of 10. 2mg  Morphine IV given. VSS. Pt turned over to right side and stated that felt better. Spoke to Dr. Butler Denmarkizwan who wanted me to talk with patient and see how he felt during this activity. Pt ambulated a short distance up and down hallway, denied chest pain, just stated his legs hurt which is chronic for him. After sitting back down on bed, pt states his pain is a 1 out of 10.

## 2017-10-10 NOTE — Care Management Note (Signed)
Case Management Note  Patient Details  Name: Ernest Wallace MRN: [id number] Date of Birth: November 09, 1950  Subjective/Objective:                    Action/Plan: Pt discharging home with self care. Pt [place] f[place]u care in [place] is from. Pt has transportation home. No further needs per CM.   Expected Discharge Date:  10/10/17               Expected Discharge Plan:  Home/Self Care  In-House Referral:     Discharge planning Services     Post Acute Care Choice:    Choice offered to:     DME Arranged:    DME Agency:     HH Arranged:    HH Agency:     Status of Service:  Completed, signed off  If discussed at [employer] Length of Stay Meetings, dates discussed:    Additional Comments:  Kermit BaloKelli F Julious Langlois, RN 10/10/2017, 1:19 PM

## 2017-10-10 NOTE — Progress Notes (Signed)
  Echocardiogram 2D Echocardiogram has been performed.  Kainoa Swoboda T Tnya Ades 10/10/2017, 11:15 AM
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
## 2017-10-10 NOTE — Plan of Care (Signed)
  Progressing Education: Knowledge of General Education information will improve 10/10/2017 1002 - Progressing by Quentin CornwallMadison, Armanie Martine, RN Health Behavior/Discharge Planning: Ability to manage health-related needs will improve 10/10/2017 1002 - Progressing by Quentin CornwallMadison, Carah Barrientes, RN Clinical Measurements: Ability to maintain clinical measurements within normal limits will improve 10/10/2017 1002 - Progressing by Quentin CornwallMadison, Isola Mehlman, RN Will remain free from infection 10/10/2017 1002 - Progressing by Quentin CornwallMadison, Fread Kottke, RN Diagnostic test results will improve 10/10/2017 1002 - Progressing by Quentin CornwallMadison, Shareta Fishbaugh, RN Respiratory complications will improve 10/10/2017 1002 - Progressing by Quentin CornwallMadison, Bryahna Lesko, RN Cardiovascular complication will be avoided 10/10/2017 1002 - Progressing by Quentin CornwallMadison, Adine Heimann, RN Activity: Risk for activity intolerance will decrease 10/10/2017 1002 - Progressing by Quentin CornwallMadison, Eshaal Duby, RN Nutrition: Adequate nutrition will be maintained 10/10/2017 1002 - Progressing by Quentin CornwallMadison, Leota Maka, RN Coping: Level of anxiety will decrease 10/10/2017 1002 - Progressing by Quentin CornwallMadison, Keiton Cosma, RN Elimination: Will not experience complications related to bowel motility 10/10/2017 1002 - Progressing by Quentin CornwallMadison, Javelle Donigan, RN Will not experience complications related to urinary retention 10/10/2017 1002 - Progressing by Quentin CornwallMadison, Tamre Cass, RN Pain Managment: General experience of comfort will improve 10/10/2017 1002 - Progressing by Quentin CornwallMadison, Benicio Manna, RN Safety: Ability to remain free from injury will improve 10/10/2017 1002 - Progressing by Quentin CornwallMadison, Atzel Mccambridge, RN Skin Integrity: Risk for impaired skin integrity will decrease 10/10/2017 1002 - Progressing by Quentin CornwallMadison, Chayanne Filippi, RN Education: Knowledge of disease or condition will improve 10/10/2017 1002 - Progressing by Quentin CornwallMadison, Aurore Redinger, RN Knowledge of secondary prevention will improve 10/10/2017 1002 - Progressing by Quentin CornwallMadison, Ozzie Knobel, RN Knowledge of patient  specific risk factors addressed and post discharge goals established will improve 10/10/2017 1002 - Progressing by Quentin CornwallMadison, Kenda Kloehn, RN Coping: Will verbalize positive feelings about self Description Will continue to monitor.No s/s noted.  10/10/2017 1002 - Progressing by Quentin CornwallMadison, Achilles Neville, RN Self-Care: Ability to participate in self-care as condition permits will improve 10/10/2017 1002 - Progressing by Quentin CornwallMadison, Tillman Kazmierski, RN Ability to communicate needs accurately will improve 10/10/2017 1002 - Progressing by Quentin CornwallMadison, Cru Kritikos, RN Nutrition: Risk of aspiration will decrease 10/10/2017 1002 - Progressing by Quentin CornwallMadison, Jamilya Sarrazin, RN Ischemic Stroke/TIA Tissue Perfusion: Complications of ischemic stroke/TIA will be minimized 10/10/2017 1002 - Progressing by Quentin CornwallMadison, Xianna Siverling, RN

## 2017-10-10 NOTE — Discharge Summary (Addendum)
Physician Discharge Summary  Ernest Wallace WUJ:[id number] DOB: Oct 05, 1951 DOA: 10/09/2017  PCP: No primary care provider on file.  Admit date: 10/09/2017 Discharge date: 10/10/2017  Admitted From: home  Disposition:  home  Discharge Condition:  stable   CODE STATUS:  Full code   Consultations:  none    Discharge Diagnoses:  Principal Problem:   Postural dizziness Active Problems:   Chest discomfort   HIV (human immunodeficiency virus infection) (HCC)   Glaucoma   OSA (obstructive sleep apnea)    Brief Summary: 66 y/o with HIV, OSA on CPAP , Glaucoma, Arthritis who presented after an episode at church. He was sitting in church when he suddenly became lightheaded, nauseated, broke out into a diffuse sweat. He was taken outside and then noted some numbness of his left face and began to panic and might have had some chest discomfot. He had a hard time getting back to baseline and was brought to the hospital. He states that he was recently at his eye doctor appt and the doctor asked him some questions after which he suggested that he have an MRI done to ensure he had not had a stroke. He did not have any stroke symptoms that day. He has noted that he woke up one day with numbness in his right arm and thought he may have slept on it. He then decided to sleep on his left side and later noted numbness in his left arm. He states his PCP recommended a carotid duplex in addition to an MRI. He has not had this work up done yet.   Hospital Course:  Episode of sweating, diaphoresis, nausea, near syncope followed by panic attack with left face numbness and questionable chest discomfort - he states he has had episode like this in church where he becomes lightheaded and sweaty but it has never been this severe- it is after he stands for a long period. At times he feels he may pass out.  - this current episode actually seems to be suggestive of orthostatic or vasovagal symptoms rather than CVA/TIA or MI  for which he was admitted overnight - he has undergone a neuro work up - head CT , MRI and MRA are negative for an acute CVA and do not show any prior CVAs either - Troponin x 3 are negative as well - EKG: RBBB, LAFB- no old EKG to compare with - 2 D ECHO: no wall motion abnormality and no thrombus see- see report documented below - at this point, I don't feel that he needs further neuro or cardiac work up and is safe to return home - I have suggested TEDS and adequate hydration to prevent further such episodes when he is at church - he can continue an aspirin daily which he takes at home  Pulmonary HTN - noted on current ECHO- he has a h/o of this and is on Revatio  HLD, HIV, chronic pain, Glaucoma - no changes in medications made    Discharge Instructions  Discharge Instructions    Diet - low sodium heart healthy   Complete by:  As directed    Increase activity slowly   Complete by:  As directed      Allergies as of 10/10/2017      Reactions   Eggs Or Egg-derived Products Anaphylaxis      Medication List    TAKE these medications   acyclovir 400 MG tablet Commonly known as:  ZOVIRAX Take 400 mg by mouth 2 (two) times daily.  aspirin 325 MG tablet Take 325 mg by mouth daily.   brinzolamide 1 % ophthalmic suspension Commonly known as:  AZOPT Place 1 drop into both eyes 3 (three) times daily.   COMBIGAN OP Apply 1 drop to eye daily.   COMBIVIR PO Take 1 tablet by mouth 2 (two) times daily.   diphenhydrAMINE 50 MG capsule Commonly known as:  BENADRYL Take 50 mg by mouth at bedtime.   fluticasone 50 MCG/ACT nasal spray Commonly known as:  FLONASE Place 2 sprays into both nostrils daily.   HYDROcodone-acetaminophen 7.5-325 MG tablet Commonly known as:  NORCO Take 1 tablet by mouth 4 (four) times daily.   HYDROcodone-acetaminophen 10-325 MG tablet Commonly known as:  NORCO Take 1 tablet by mouth 3 (three) times daily as needed for pain.   LIPITOR 20 MG  tablet Generic drug:  atorvastatin Take 20 mg by mouth daily.   LUMIGAN 0.01 % Soln Generic drug:  bimatoprost Place 1 drop into both eyes at bedtime.   methocarbamol 750 MG tablet Commonly known as:  ROBAXIN Take 750 mg by mouth 3 (three) times daily as needed for muscle spasms.   sildenafil 20 MG tablet Commonly known as:  REVATIO Take 20 mg by mouth as needed for erectile dysfunction.   VENTOLIN HFA 108 (90 Base) MCG/ACT inhaler Generic drug:  albuterol Inhale 1-2 puffs into the lungs every 6 (six) hours as needed for shortness of breath.   VIRAMUNE 200 MG tablet Generic drug:  nevirapine Take 200 mg by mouth 2 (two) times daily.       Allergies  Allergen Reactions  . Eggs Or Egg-Derived Products Anaphylaxis     Procedures/Studies: 2 d ECHO Study Conclusions  - Left ventricle: The cavity size was normal. There was severe   focal basal and moderate concentric hypertrophy. Systolic   function was normal. The estimated ejection fraction was in the   range of 60% to 65%. Wall motion was normal; there were no   regional wall motion abnormalities. - Aortic valve: There was mild to moderate regurgitation directed   eccentrically in the LVOT and towards the mitral anterior   leaflet. - Mitral valve: Mild diffuse thickening of the anterior leaflet.   There was mild regurgitation. - Left atrium: The atrium was mildly dilated. - Right ventricle: The cavity size was mildly dilated. Wall   thickness was normal. - Right atrium: The atrium was mildly dilated. - Pulmonary arteries: PA peak pressure: 61 mm Hg (S).  Impressions:  - The right ventricular systolic pressure was increased consistent   with moderate pulmonary hypertension.  Dg Chest 2 View  Result Date: 10/09/2017 CLINICAL DATA:  Pt c/o nausea, chest pain, sweating, left sided face drooping, lip and arm numbness, weakness, dizziness and irregular BP x a few days. EXAM: CHEST  2 VIEW COMPARISON:  None.  FINDINGS: Cardiac silhouette is normal in size and configuration. Normal mediastinal and hilar contours. Lungs are clear.  No pleural effusion or pneumothorax. There has been a previous posterior fusion at the thoracolumbar junction. Skeletal structures are intact. IMPRESSION: No active cardiopulmonary disease. Electronically Signed   By: Amie Portlandavid  Ormond M.D.   On: 10/09/2017 14:21   Ct Head Wo Contrast  Result Date: 10/09/2017 CLINICAL DATA:  Lt side facial numbness slurred speech aphasia this morning seems to be ok now EXAM: CT HEAD WITHOUT CONTRAST TECHNIQUE: Contiguous axial images were obtained from the base of the skull through the vertex without intravenous contrast. COMPARISON:  None. FINDINGS: Brain:  No evidence of acute infarction, hemorrhage, hydrocephalus, extra-axial collection or mass lesion/mass effect. Vascular: No hyperdense vessel or unexpected calcification. Skull: Normal. Negative for fracture or focal lesion. Sinuses/Orbits: Globes and orbits are unremarkable. Visualized sinuses and mastoid air cells are clear. Other: None. IMPRESSION: Normal unenhanced CT scan of the brain. Electronically Signed   By: Amie Portland M.D.   On: 10/09/2017 14:09   Mr Brain Wo Contrast  Result Date: 10/09/2017 CLINICAL DATA:  Initial evaluation for acute left-sided numbness EXAM: MRI HEAD WITHOUT CONTRAST MRA HEAD WITHOUT CONTRAST TECHNIQUE: Multiplanar, multiecho pulse sequences of the brain and surrounding structures were obtained without intravenous contrast. Angiographic images of the head were obtained using MRA technique without contrast. COMPARISON:  Prior CT from earlier the same day. FINDINGS: MRI HEAD FINDINGS Brain: Cerebral volume within normal limits. Mild patchy T2/FLAIR hyperintensity within the periventricular white matter, nonspecific, but most like related chronic small vessel ischemic disease, mild for age. No abnormal foci of restricted diffusion to suggest acute or subacute ischemia.  Gray-white matter differentiation maintained. No encephalomalacia to suggest chronic infarction. No acute or chronic intracranial hemorrhage. No mass lesion, midline shift or mass effect. No hydrocephalus. No extra-axial fluid collection. Major dural sinuses are grossly patent. Pituitary gland suprasellar region normal. Midline structures intact and normal. Vascular: Major intracranial vascular flow voids are maintained. Skull and upper cervical spine: Craniocervical junction normal. Upper cervical spine within normal limits. Bone marrow signal intensity normal. No scalp soft tissue abnormality. Sinuses/Orbits: Globes normal soft tissues within normal limits. Paranasal sinuses are clear. No mastoid effusion. Inner ear structures normal. Other: None. MRA HEAD FINDINGS ANTERIOR CIRCULATION: Distal cervical segments of the internal carotid arteries are widely patent with antegrade flow. Petrous, cavernous, and supraclinoid segments are widely patent without stenosis. Origin of the ophthalmic artery is are normal. ICA termini widely patent. A1 segments widely patent bilaterally. Normal anterior communicating artery. Anterior cerebral artery is widely patent to their distal aspects without stenosis. M1 segments patent without stenosis. Normal MCA bifurcations. No proximal M2 occlusion. Distal MCA branches well perfused and symmetric. POSTERIOR CIRCULATION: Vertebral artery is widely patent to the vertebrobasilar junction without stenosis. Partially visualized posterior inferior cerebral arteries patent bilaterally. Basilar artery mildly tortuous but widely patent to its distal aspect without stenosis. Superior cerebral arteries patent bilaterally. Both of the posterior cerebral arteries supplied via the basilar and are well perfused to their distal aspects. Small bilateral posterior communicating arteries noted. No aneurysm. IMPRESSION: MRI HEAD IMPRESSION: 1. No acute intracranial abnormality identified. 2. Mild for age  chronic small vessel ischemic disease. MRA HEAD IMPRESSION: Normal MRA of the head. Electronically Signed   By: Rise Mu M.D.   On: 10/09/2017 23:20   Mr Maxine Glenn Head Wo Contrast  Result Date: 10/09/2017 CLINICAL DATA:  Initial evaluation for acute left-sided numbness EXAM: MRI HEAD WITHOUT CONTRAST MRA HEAD WITHOUT CONTRAST TECHNIQUE: Multiplanar, multiecho pulse sequences of the brain and surrounding structures were obtained without intravenous contrast. Angiographic images of the head were obtained using MRA technique without contrast. COMPARISON:  Prior CT from earlier the same day. FINDINGS: MRI HEAD FINDINGS Brain: Cerebral volume within normal limits. Mild patchy T2/FLAIR hyperintensity within the periventricular white matter, nonspecific, but most like related chronic small vessel ischemic disease, mild for age. No abnormal foci of restricted diffusion to suggest acute or subacute ischemia. Gray-white matter differentiation maintained. No encephalomalacia to suggest chronic infarction. No acute or chronic intracranial hemorrhage. No mass lesion, midline shift or mass effect. No hydrocephalus. No  extra-axial fluid collection. Major dural sinuses are grossly patent. Pituitary gland suprasellar region normal. Midline structures intact and normal. Vascular: Major intracranial vascular flow voids are maintained. Skull and upper cervical spine: Craniocervical junction normal. Upper cervical spine within normal limits. Bone marrow signal intensity normal. No scalp soft tissue abnormality. Sinuses/Orbits: Globes normal soft tissues within normal limits. Paranasal sinuses are clear. No mastoid effusion. Inner ear structures normal. Other: None. MRA HEAD FINDINGS ANTERIOR CIRCULATION: Distal cervical segments of the internal carotid arteries are widely patent with antegrade flow. Petrous, cavernous, and supraclinoid segments are widely patent without stenosis. Origin of the ophthalmic artery is are  normal. ICA termini widely patent. A1 segments widely patent bilaterally. Normal anterior communicating artery. Anterior cerebral artery is widely patent to their distal aspects without stenosis. M1 segments patent without stenosis. Normal MCA bifurcations. No proximal M2 occlusion. Distal MCA branches well perfused and symmetric. POSTERIOR CIRCULATION: Vertebral artery is widely patent to the vertebrobasilar junction without stenosis. Partially visualized posterior inferior cerebral arteries patent bilaterally. Basilar artery mildly tortuous but widely patent to its distal aspect without stenosis. Superior cerebral arteries patent bilaterally. Both of the posterior cerebral arteries supplied via the basilar and are well perfused to their distal aspects. Small bilateral posterior communicating arteries noted. No aneurysm. IMPRESSION: MRI HEAD IMPRESSION: 1. No acute intracranial abnormality identified. 2. Mild for age chronic small vessel ischemic disease. MRA HEAD IMPRESSION: Normal MRA of the head. Electronically Signed   By: Rise Mu M.D.   On: 10/09/2017 23:20       Discharge Exam: Vitals:   10/10/17 0933 10/10/17 1055  BP: (!) 116/55 (!) 152/89  Pulse: 61 60  Resp: 18   Temp: 97.8 F (36.6 C)   SpO2: 100% 100%   Vitals:   10/10/17 0029 10/10/17 0456 10/10/17 0933 10/10/17 1055  BP: (!) 106/47 106/69 (!) 116/55 (!) 152/89  Pulse: (!) 56 (!) 57 61 60  Resp: 18 18 18    Temp: 98.6 F (37 C) 98.5 F (36.9 C) 97.8 F (36.6 C)   TempSrc: Oral Oral Oral   SpO2: 100% 100% 100% 100%  Weight:      Height:        General: Pt is alert, awake, not in acute distress Cardiovascular: RRR, S1/S2 +, no rubs, no gallops Respiratory: CTA bilaterally, no wheezing, no rhonchi Abdominal: Soft, NT, ND, bowel sounds + Extremities: no edema, no cyanosis    The results of significant diagnostics from this hospitalization (including imaging, microbiology, ancillary and laboratory) are  listed below for reference.     Microbiology: No results found for this or any previous visit (from the past 240 hour(s)).   Labs: BNP (last 3 results) No results for input(s): BNP in the last 8760 hours. Basic Metabolic Panel: Recent Labs  Lab 10/09/17 1243  NA 139  K 3.6  CL 108  CO2 25  GLUCOSE 100*  BUN 15  CREATININE 0.95  CALCIUM 9.1   Liver Function Tests: No results for input(s): AST, ALT, ALKPHOS, BILITOT, PROT, ALBUMIN in the last 168 hours. No results for input(s): LIPASE, AMYLASE in the last 168 hours. No results for input(s): AMMONIA in the last 168 hours. CBC: Recent Labs  Lab 10/09/17 1243  WBC 3.7*  HGB 12.5*  HCT 37.1*  MCV 105.1*  PLT 195   Cardiac Enzymes: Recent Labs  Lab 10/09/17 1811 10/10/17 0041  TROPONINI <0.03 <0.03   BNP: Invalid input(s): POCBNP CBG: Recent Labs  Lab 10/09/17 1250  GLUCAP  88   D-Dimer No results for input(s): DDIMER in the last 72 hours. Hgb A1c Recent Labs    10/10/17 0041  HGBA1C 5.3   Lipid Profile Recent Labs    10/09/17 2359  CHOL 158  HDL 50  LDLCALC 99  TRIG 47  CHOLHDL 3.2   Thyroid function studies No results for input(s): TSH, T4TOTAL, T3FREE, THYROIDAB in the last 72 hours.  Invalid input(s): FREET3 Anemia work up No results for input(s): VITAMINB12, FOLATE, FERRITIN, TIBC, IRON, RETICCTPCT in the last 72 hours. Urinalysis    Component Value Date/Time   COLORURINE YELLOW 10/09/2017 1243   APPEARANCEUR CLEAR 10/09/2017 1243   LABSPEC 1.012 10/09/2017 1243   PHURINE 6.0 10/09/2017 1243   GLUCOSEU NEGATIVE 10/09/2017 1243   HGBUR SMALL (A) 10/09/2017 1243   BILIRUBINUR NEGATIVE 10/09/2017 1243   KETONESUR NEGATIVE 10/09/2017 1243   PROTEINUR NEGATIVE 10/09/2017 1243   NITRITE NEGATIVE 10/09/2017 1243   LEUKOCYTESUR NEGATIVE 10/09/2017 1243   Sepsis Labs Invalid input(s): PROCALCITONIN,  WBC,  LACTICIDVEN Microbiology No results found for this or any previous visit (from  the past 240 hour(s)).   Time coordinating discharge: Over 30 minutes  SIGNED:   Calvert CantorSaima Tava Peery, MD  [facility] 10/10/2017, 1:00 PM Pager   If 7PM-7AM, please contact night-coverage [email or web address] Password [email or web address]

## 2017-10-12 NOTE — Progress Notes (Signed)
G-Code Late Entry - OT Evaluation    10/10/17 0800  OT Time Calculation  OT Start Time (ACUTE ONLY) 0802  OT Stop Time (ACUTE ONLY) 16100833  OT Time Calculation (min) 31 min  OT G-codes **NOT FOR INPATIENT CLASS**  Functional Assessment Tool Used Clinical judgement  Functional Limitation Self care  Self Care Current Status (R6045(G8987) CJ  Self Care Goal Status (W0981(G8988) CI  Self Care Discharge Status (X9147(G8989) CJ   [name] MSOT, OTR/L Acute Rehab Pager: [phone] Office: [phone]

## 2017-10-14 NOTE — Progress Notes (Signed)
Physical Therapy Note  Late Entry for PT eval 12/24 at 7am. These are the G-codes associated with the PT eval on 12/24 at 7am.    10/10/17 0705  PT G-Codes **NOT FOR INPATIENT CLASS**  Functional Assessment Tool Used Clinical judgement  Functional Limitation Mobility: Walking and moving around  Mobility: Walking and Moving Around Current Status (Z6109(G8978) CI  Mobility: Walking and Moving Around Goal Status (U0454(G8979) CI    Lewis ShockAshly Endiya Klahr, PT, DPT Pager #: [phone] Office #: [phone]

## 2018-05-27 IMAGING — MR MR HEAD W/O CM
9 of 12 series · 28 of 48 positions shown · non-contrast
Comparison: Prior CT from earlier the same day.

CLINICAL DATA: Initial evaluation for acute left-sided numbness

EXAM:
MRI HEAD WITHOUT CONTRAST
MRA HEAD WITHOUT CONTRAST
TECHNIQUE: Multiplanar, multiecho pulse sequences of the brain and surrounding
structures were obtained without intravenous contrast. Angiographic
images of the head were obtained using MRA technique without
contrast.

[Series 3: DWI · axial · 3.0mm · 0.94mm/px · z∈[-50,+97]mm · 5 of 100 slices shown (1 of 2)]
[im 1/100]
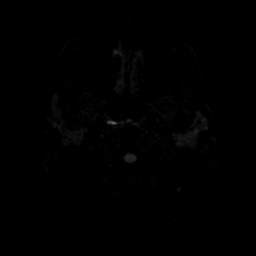
[im 25/100]
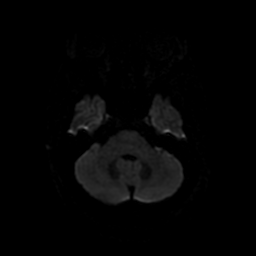
[im 50/100]
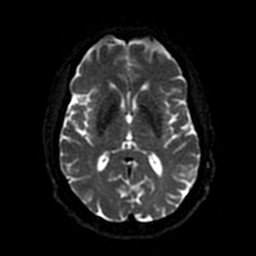
[im 75/100]
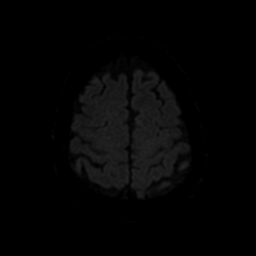
[im 100/100]
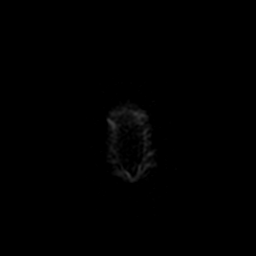

[Series 4: ax (id) 2 · axial · 1.0mm · 0.43mm/px · z∈[-54,+12]mm · 6 of 184 slices shown]
[im 1/184]
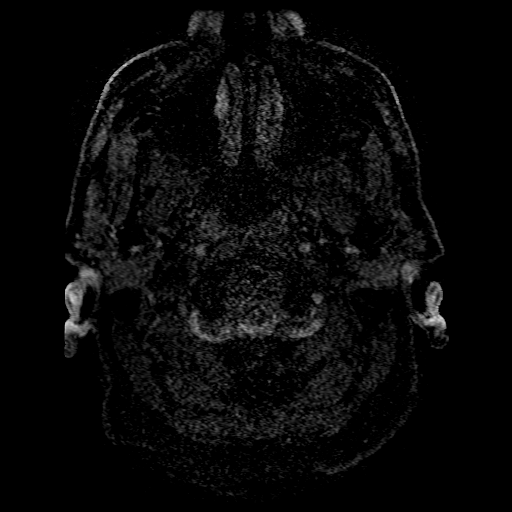
[im 34/184]
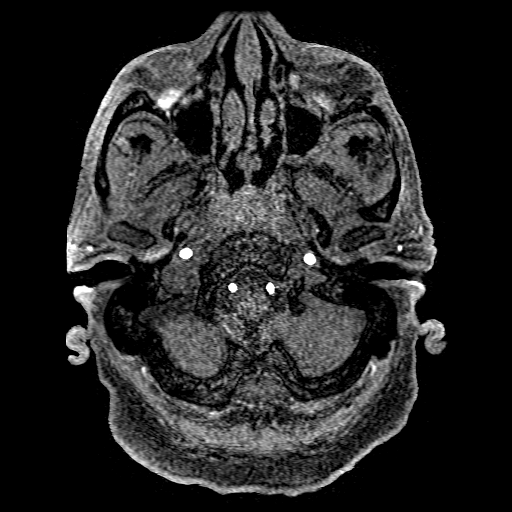
[im 50/184]
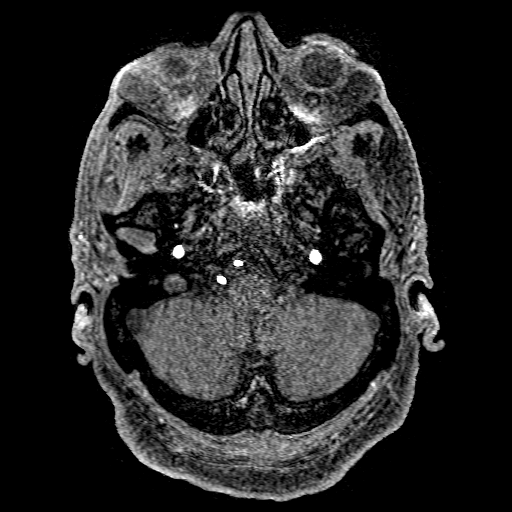
[im 84/184]
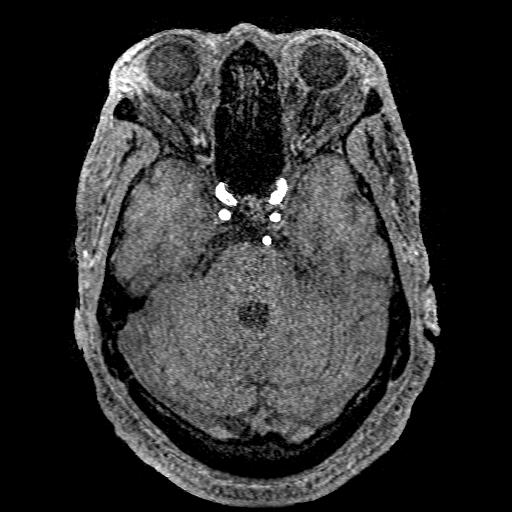
[im 100/184]
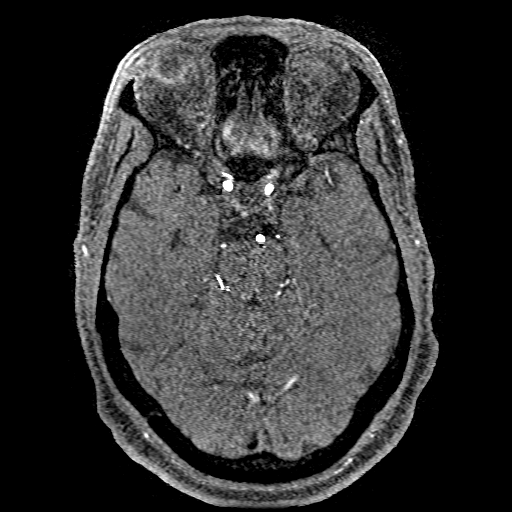
[im 134/184]
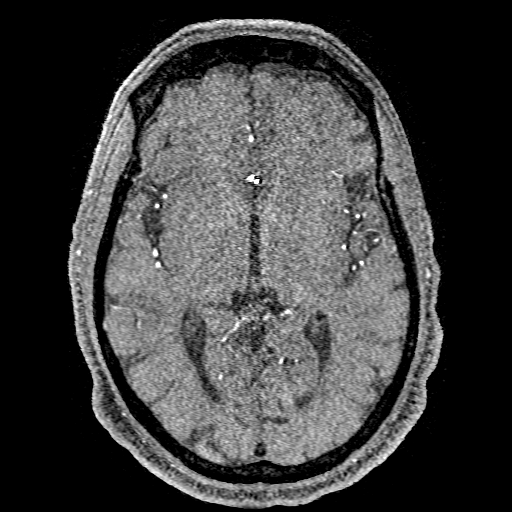

[Series 5: FLAIR · sagittal · 5.0mm · 0.47mm/px · 1 of 23 slices shown (1 of 2)]
[im 1/23]
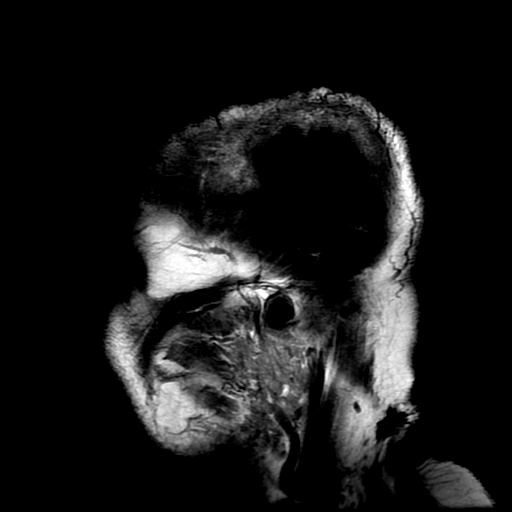

[Series 6: DWI · coronal · 4.0mm · 0.94mm/px · 5 of 72 slices shown (2 of 2)]
[im 1/72]
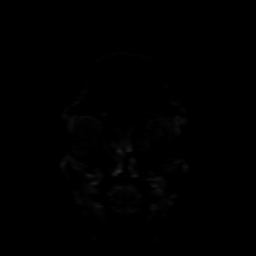
[im 18/72]
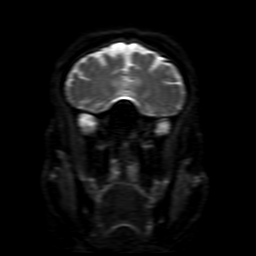
[im 36/72]
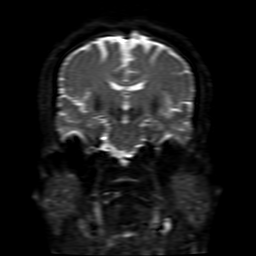
[im 54/72]
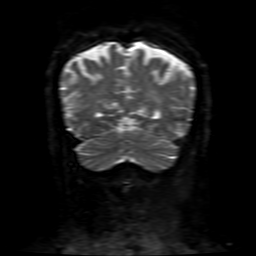
[im 72/72]
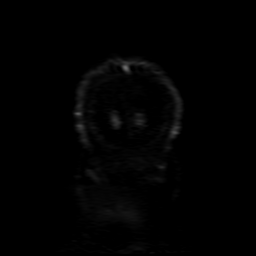

[Series 7: T2 · axial · 5.0mm · 0.43mm/px · z∈[-49,+89]mm · 2 of 24 slices shown]
[im 1/24]
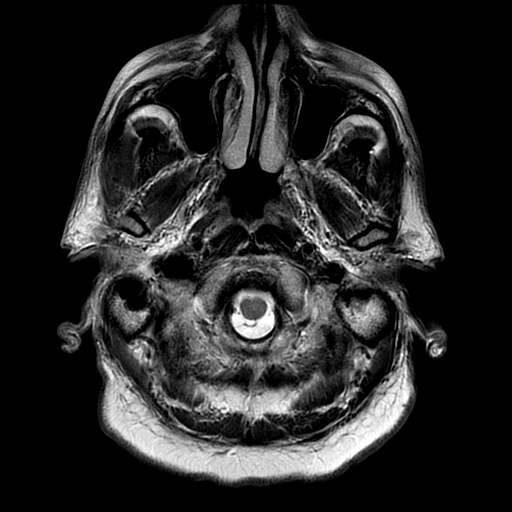
[im 24/24]
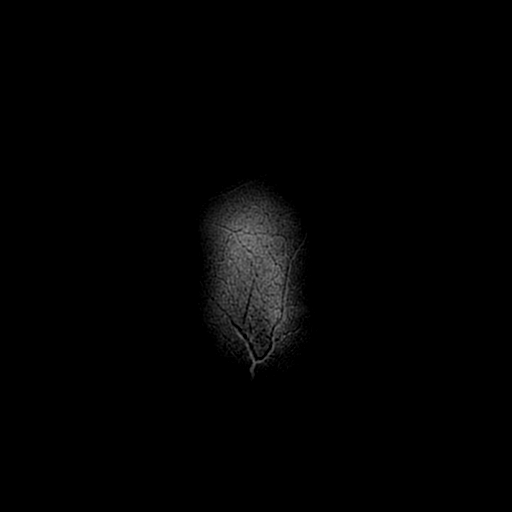

[Series 8: FLAIR · axial · 3.0mm · 0.43mm/px · z∈[-49,+89]mm · 2 of 24 slices shown (2 of 2)]
[im 1/24]
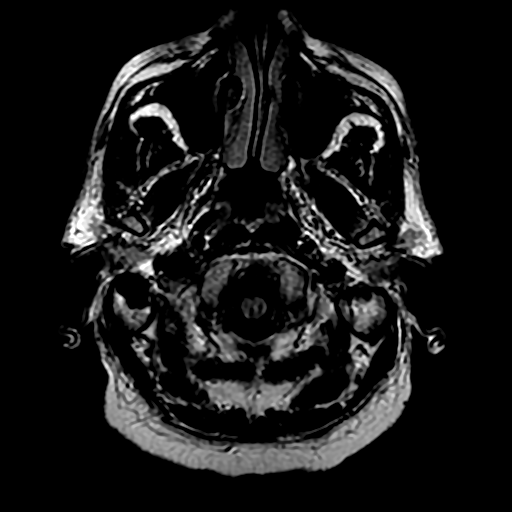
[im 24/24]
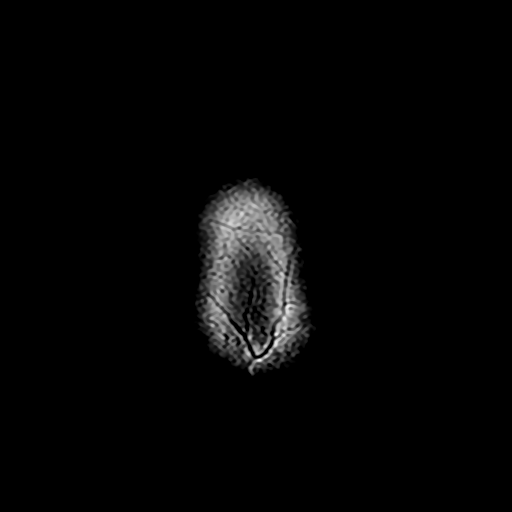

[Series 11: T2 post-contrast · coronal · 5.0mm · 0.39mm/px · 2 of 30 slices shown]
[im 1/30]
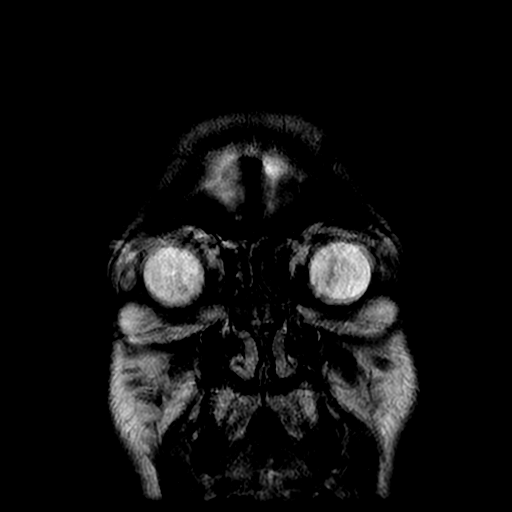
[im 30/30]
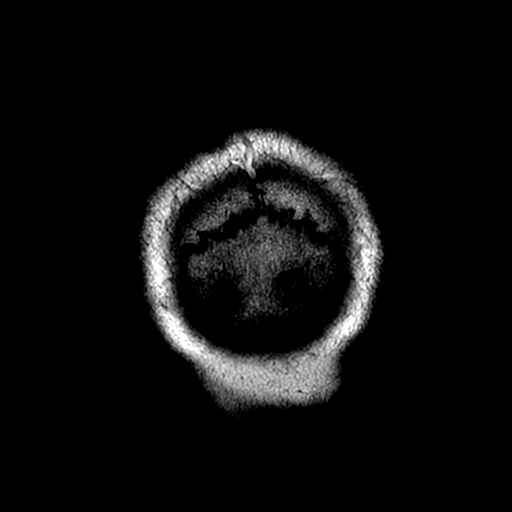

[Series 350: ADC · axial · 3.0mm · 0.94mm/px · z∈[-50,+97]mm · 3 of 50 slices shown (1 of 2)]
[im 1/50]
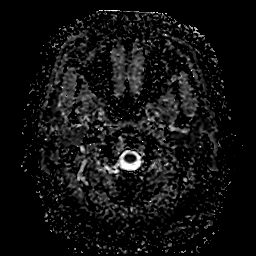
[im 25/50]
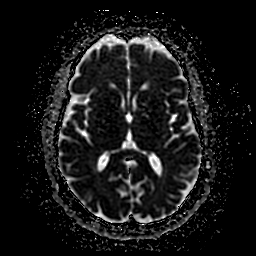
[im 50/50]
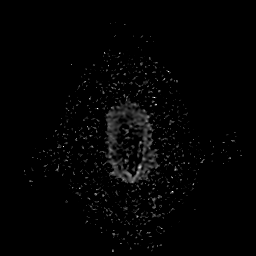

[Series 650: ADC · coronal · 4.0mm · 0.94mm/px · 2 of 36 slices shown (2 of 2)]
[im 1/36]
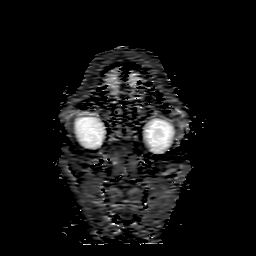
[im 36/36]
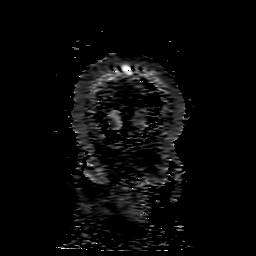

[28 of 48 positions shown; findings below may reference images not displayed]

FINDINGS: MRI HEAD FINDINGS

Brain: Cerebral volume within normal limits. Mild patchy T2/FLAIR
hyperintensity within the periventricular white matter, nonspecific,
but most like related chronic small vessel ischemic disease, mild
for age. No abnormal foci of restricted diffusion to suggest acute
or subacute ischemia. Gray-white matter differentiation maintained.
No encephalomalacia to suggest chronic infarction. No acute or
chronic intracranial hemorrhage.

No mass lesion, midline shift or mass effect. No hydrocephalus. No
extra-axial fluid collection. Major dural sinuses are grossly
patent.

Pituitary gland suprasellar region normal. Midline structures intact
and normal.

Vascular: Major intracranial vascular flow voids are maintained.

Skull and upper cervical spine: Craniocervical junction normal.
Upper cervical spine within normal limits. Bone marrow signal
intensity normal. No scalp soft tissue abnormality.

Sinuses/Orbits: Globes normal soft tissues within normal limits.
Paranasal sinuses are clear. No mastoid effusion. Inner ear
structures normal.

Other: None.

MRA HEAD FINDINGS

ANTERIOR CIRCULATION:

Distal cervical segments of the internal carotid arteries are widely
patent with antegrade flow. Petrous, cavernous, and supraclinoid
segments are widely patent without stenosis. Origin of the
ophthalmic artery is are normal. ICA termini widely patent. A1
segments widely patent bilaterally. Normal anterior communicating
artery. Anterior cerebral artery is widely patent to their distal
aspects without stenosis. M1 segments patent without stenosis.
Normal MCA bifurcations. No proximal M2 occlusion. Distal MCA
branches well perfused and symmetric.

POSTERIOR CIRCULATION:

Vertebral artery is widely patent to the vertebrobasilar junction
without stenosis. Partially visualized posterior inferior cerebral
arteries patent bilaterally. Basilar artery mildly tortuous but
widely patent to its distal aspect without stenosis. Superior
cerebral arteries patent bilaterally. Both of the posterior cerebral
arteries supplied via the basilar and are well perfused to their
distal aspects. Small bilateral posterior communicating arteries
noted.

No aneurysm.
IMPRESSION: MRI HEAD IMPRESSION:

1. No acute intracranial abnormality identified.
2. Mild for age chronic small vessel ischemic disease.

MRA HEAD IMPRESSION:

Normal MRA of the head.

## 2018-05-27 IMAGING — CT CT HEAD W/O CM
4 series · 16 of 47 positions shown, 18 images · non-contrast
Comparison: None.

CLINICAL DATA: Lt side facial numbness slurred speech aphasia this
morning seems to be ok now

EXAM:
CT HEAD WITHOUT CONTRAST
TECHNIQUE: Contiguous axial images were obtained from the base of the skull
through the vertex without intravenous contrast.

[Series 3: head without · axial · non-contrast · 0.52mm/px · z∈[-112,+13]mm · 7 of 35 slices shown, 9 images]
[im 5/35  brain]
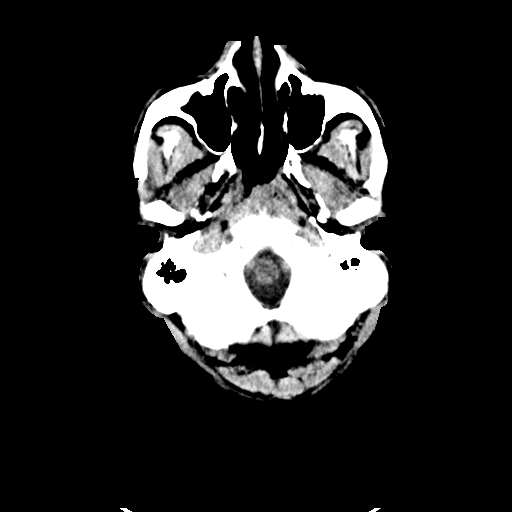
[im 5/35  bone]
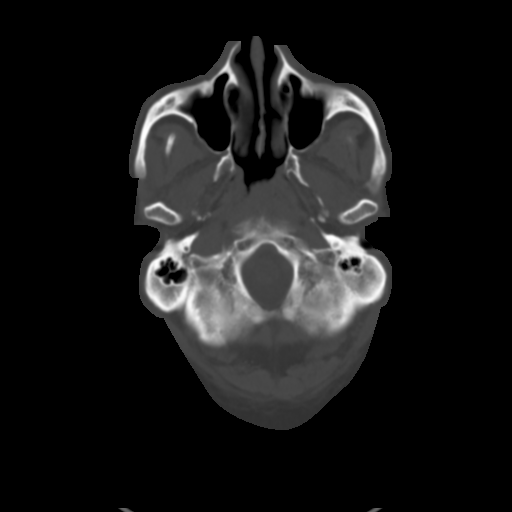
[im 9/35  brain]
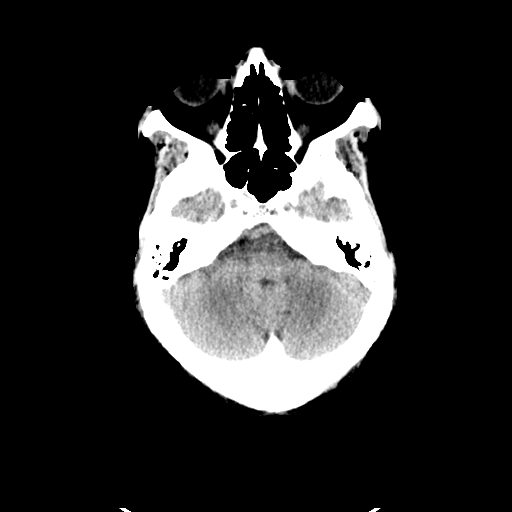
[im 13/35  brain]
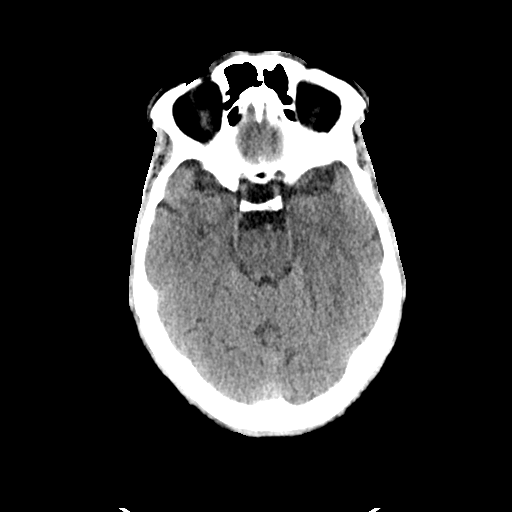
[im 18/35  brain]
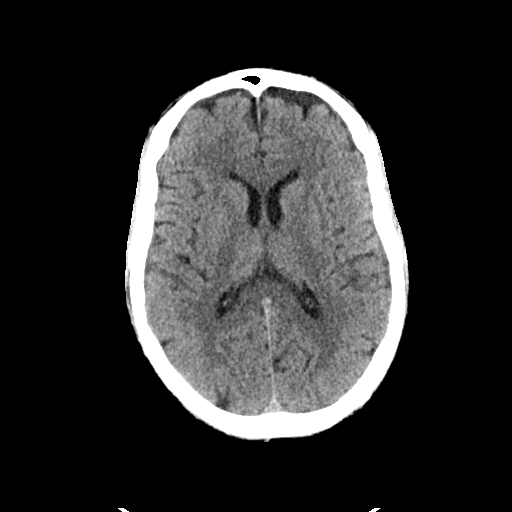
[im 22/35  brain]
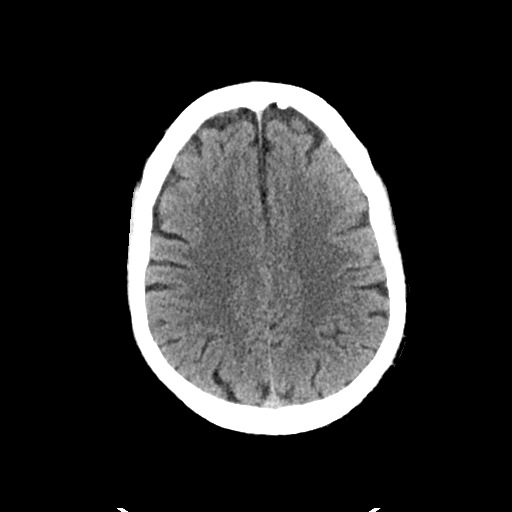
[im 22/35  bone]
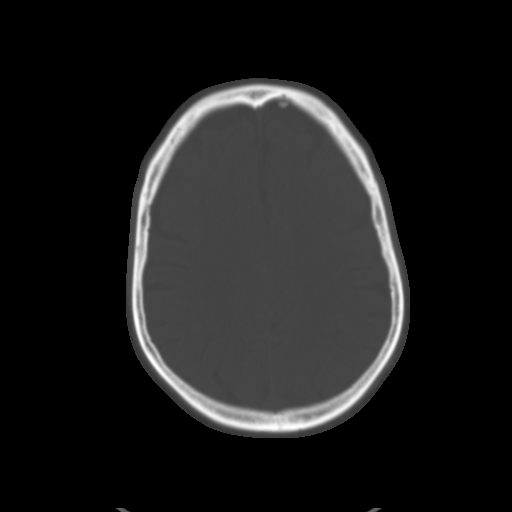
[im 26/35  brain]
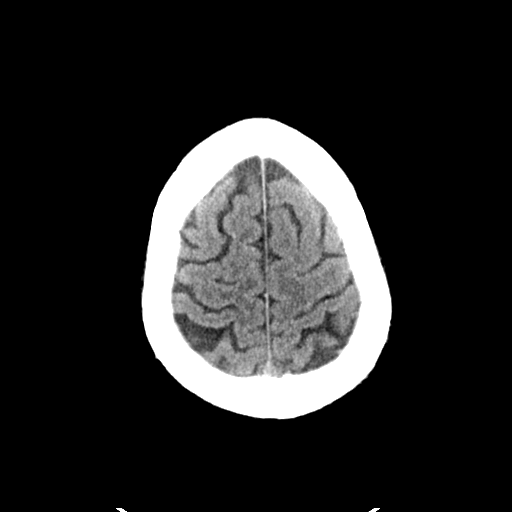
[im 30/35  brain]
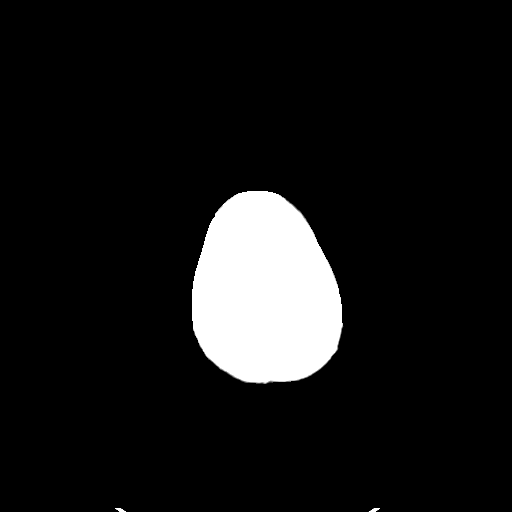

[Series 4: head bone · axial · 0.52mm/px · z∈[-116,-82]mm · 3 of 86 slices shown]
[im 9/86  bone]
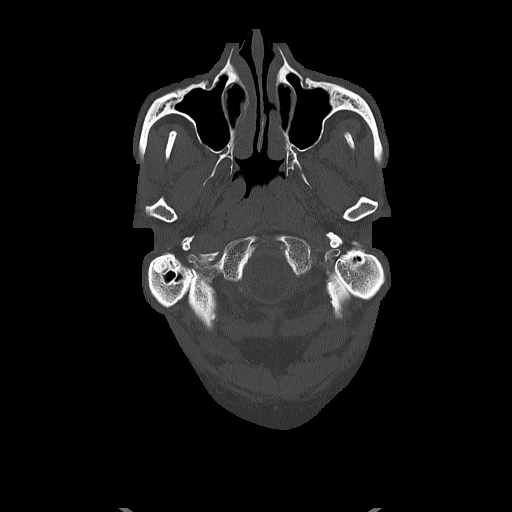
[im 18/86  bone]
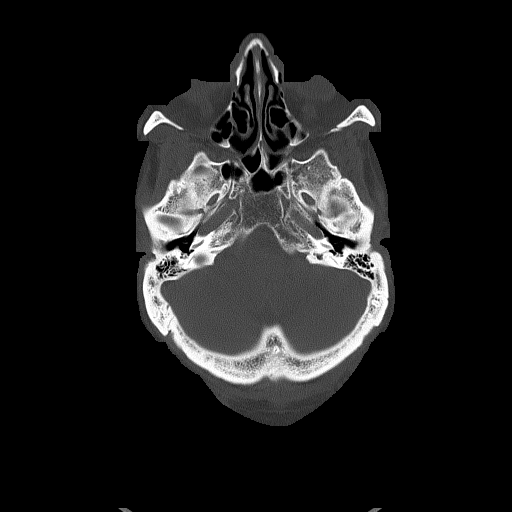
[im 26/86  bone]
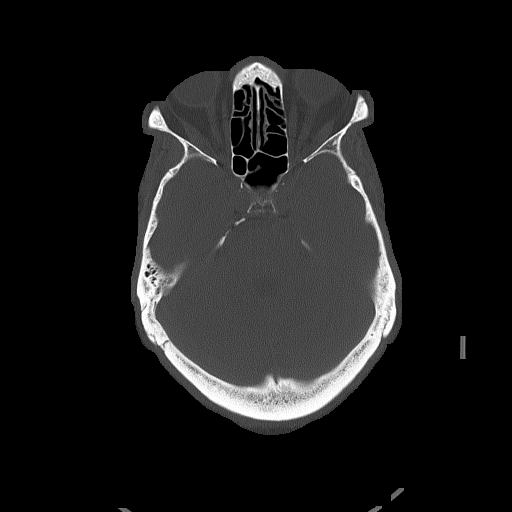

[Series 5: head without cor · coronal · non-contrast · 0.32mm/px · 3 of 79 slices shown]
[im 27/79  brain]
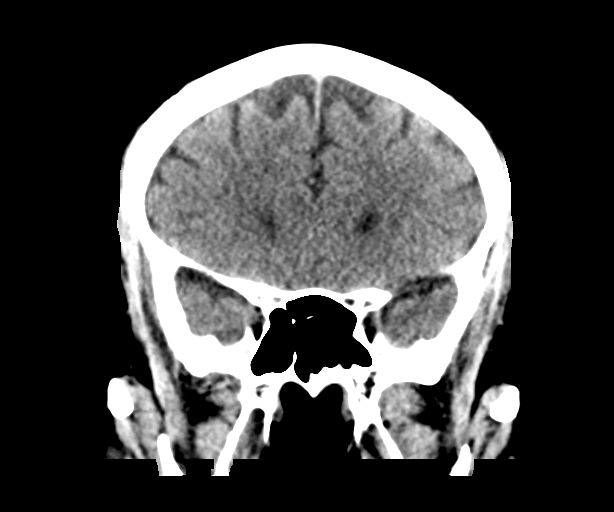
[im 35/79  brain]
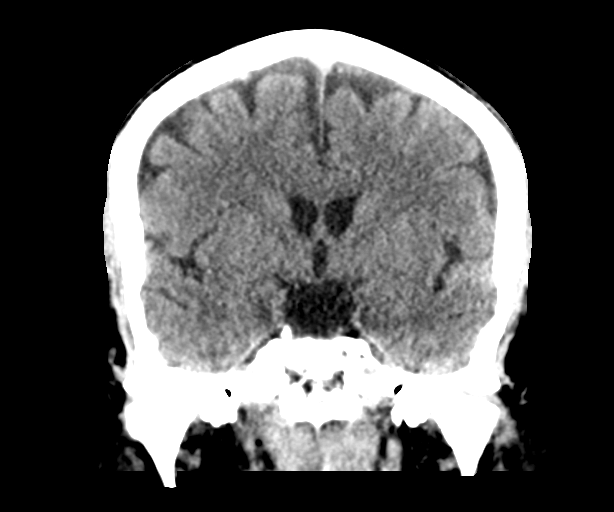
[im 44/79  brain]
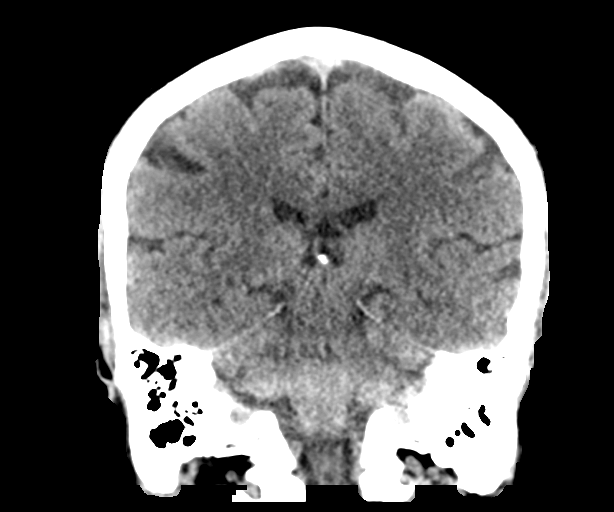

[Series 6: head without sag · sagittal · non-contrast · 0.37mm/px · 3 of 67 slices shown]
[im 23/67  brain]
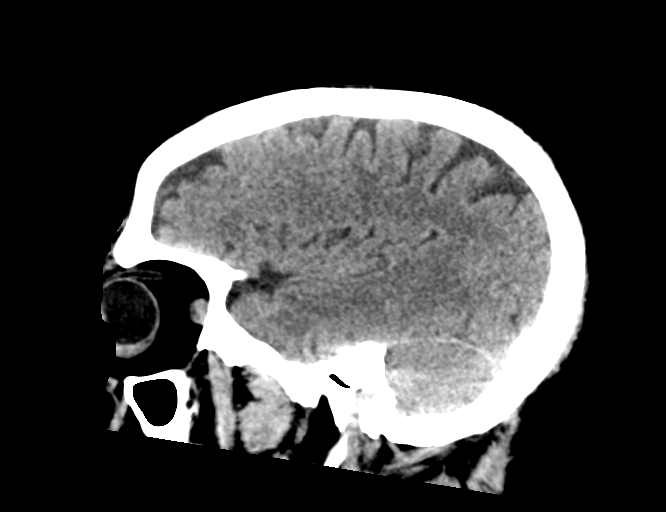
[im 34/67  brain]
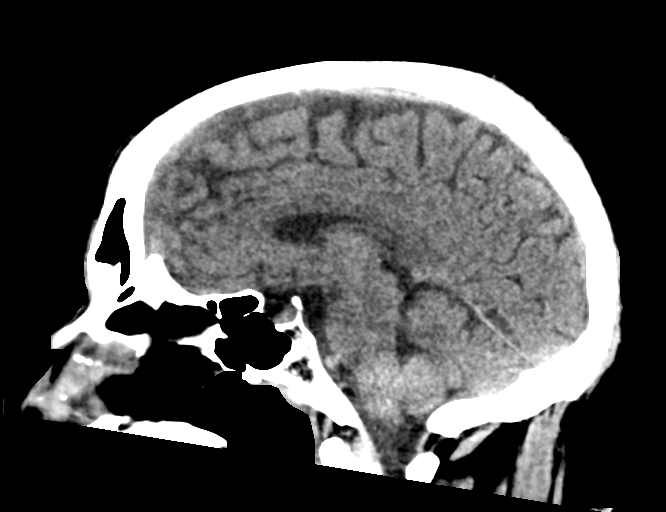
[im 45/67  brain]
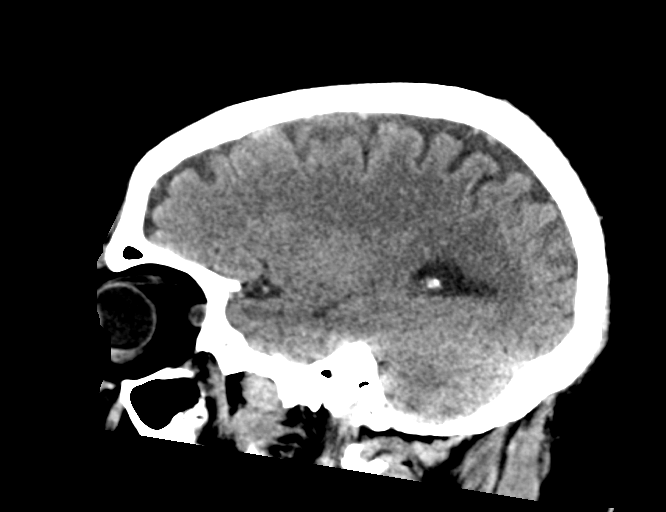

[16 of 47 positions shown; findings below may reference images not displayed]

FINDINGS: Brain: No evidence of acute infarction, hemorrhage, hydrocephalus,
extra-axial collection or mass lesion/mass effect.

Vascular: No hyperdense vessel or unexpected calcification.

Skull: Normal. Negative for fracture or focal lesion.

Sinuses/Orbits: Globes and orbits are unremarkable. Visualized
sinuses and mastoid air cells are clear.

Other: None.
IMPRESSION: Normal unenhanced CT scan of the brain.
